# Patient Record
Sex: Male | Born: 1985 | Race: White | Hispanic: No | Marital: Single | Smoking: Current every day smoker
Health system: Southern US, Community
[De-identification: ages and names within clinical notes are randomized; demographics above are authoritative.]

## PROBLEM LIST (undated history)

## (undated) DIAGNOSIS — F429 Obsessive-compulsive disorder, unspecified: Secondary | ICD-10-CM

## (undated) DIAGNOSIS — F988 Other specified behavioral and emotional disorders with onset usually occurring in childhood and adolescence: Secondary | ICD-10-CM

## (undated) HISTORY — DX: Obsessive-compulsive disorder, unspecified: F42.9

## (undated) HISTORY — PX: EYE SURGERY: SHX253

## (undated) HISTORY — PX: HERNIA REPAIR: SHX51

## (undated) HISTORY — PX: TYMPANOSTOMY TUBE PLACEMENT: SHX32

## (undated) HISTORY — DX: Other specified behavioral and emotional disorders with onset usually occurring in childhood and adolescence: F98.8

---

## 2014-08-16 ENCOUNTER — Ambulatory Visit (INDEPENDENT_AMBULATORY_CARE_PROVIDER_SITE_OTHER): Payer: Self-pay | Admitting: Family Medicine

## 2014-08-16 VITALS — BP 116/80 | HR 62 | Temp 97.9°F | Resp 20 | Ht 67.75 in | Wt 229.5 lb

## 2014-08-16 DIAGNOSIS — F42 Obsessive-compulsive disorder: Secondary | ICD-10-CM

## 2014-08-16 DIAGNOSIS — F988 Other specified behavioral and emotional disorders with onset usually occurring in childhood and adolescence: Secondary | ICD-10-CM

## 2014-08-16 DIAGNOSIS — F909 Attention-deficit hyperactivity disorder, unspecified type: Secondary | ICD-10-CM

## 2014-08-16 DIAGNOSIS — F429 Obsessive-compulsive disorder, unspecified: Secondary | ICD-10-CM

## 2014-08-16 MED ORDER — AMPHETAMINE-DEXTROAMPHETAMINE 30 MG PO TABS: 30.0000 mg | ORAL_TABLET | Freq: Every day | ORAL | Status: DC

## 2014-08-16 NOTE — Patient Instructions (Signed)
[facility] Policy for Prescribing Controlled Substances (Revised 07/2012) 1. Prescriptions for controlled substances will be filled by ONE provider at [facility] with whom you have established and developed a plan for your care, including follow-up. 2. You are encouraged to schedule an appointment with your prescriber at our appointment center for follow-up visits whenever possible. 3. If you request a prescription for the controlled substance while at [facility] for an acute problem (with someone other than your regular prescriber), you MAY be given a ONE-TIME prescription for a 30-day supply of the controlled substance, to allow time for you to return to see your regular prescriber for additional prescriptions. 

## 2014-08-16 NOTE — Progress Notes (Signed)
Subjective:    Patient ID: Matthew Hines, male    DOB: 1985/10/22, 27 y.o.   MRN: [id number]  08/16/2014  Medication Refill   HPI This 28 y.o. male presents for evaluation of medication refill.  Requesting refill for Adderall.  Moved here from [place]; moved to [place] one month ago.  Evaluated one month ago by Kathreen Devoid; prescribed on month refill of Adderall 30mg  daily at [facility] after hours.  No family care.  Diagnosed age 75.  Recently reevaluated in 2013.  Previous use of Ritalin, generic Adderall, Adderall XR name brand,  Current dose since high school for past seven years.  No side effects.  Sleeps well.  No headaches; no palpitations.  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  Moved [place] for employment.  Cousin in [place] and in [place].  Excessive worry; diagnosed with OCD in past.     Review of Systems  Constitutional: Negative for fever, chills, diaphoresis, activity change, appetite change and fatigue.  Eyes: Negative for visual disturbance.  Respiratory: Negative for cough and shortness of breath.   Cardiovascular: Negative for chest pain, palpitations and leg swelling.  Endocrine: Negative for cold intolerance, heat intolerance, polydipsia, polyphagia and polyuria.  Neurological: Negative for dizziness, tremors, seizures, syncope, facial asymmetry, speech difficulty, weakness, light-headedness, numbness and headaches.    Past Medical History  Diagnosis Date  . ADD (attention deficit disorder)   . OCD (obsessive compulsive disorder)    Past Surgical History  Procedure Laterality Date  . Hernia repair    . Eye surgery    . Tympanostomy tube placement     Allergies  Allergen Reactions  . Amoxicillin Rash  . Penicillins Rash   Current Outpatient Prescriptions  Medication Sig Dispense Refill  . amphetamine-dextroamphetamine (ADDERALL) 30 MG tablet Take 1 tablet by mouth daily. 30 tablet 0   No current facility-administered medications for  this visit.       Objective:    BP 116/80 mmHg  Pulse 62  Temp(Src) 97.9 F (36.6 C) (Oral)  Resp 20  Ht 5' 7.75" (1.721 m)  Wt 229 lb 8 oz (104.101 kg)  BMI 35.15 kg/m2  SpO2 97% Physical Exam  Constitutional: He is oriented to person, place, and time. He appears well-developed and well-nourished. No distress.  HENT:  Head: Normocephalic and atraumatic.  Eyes: Conjunctivae and EOM are normal. Pupils are equal, round, and reactive to light.  Neck: Normal range of motion. Neck supple. Carotid bruit is not present. No thyromegaly present.  Cardiovascular: Normal rate, regular rhythm, normal heart sounds and intact distal pulses.  Exam reveals no gallop and no friction rub.   No murmur heard. Pulmonary/Chest: Effort normal and breath sounds normal. He has no wheezes. He has no rales.  Lymphadenopathy:    He has no cervical adenopathy.  Neurological: He is alert and oriented to person, place, and time. No cranial nerve deficit. He exhibits normal muscle tone. Coordination normal.  Skin: Skin is warm and dry. No rash noted. He is not diaphoretic.  Psychiatric: He has a normal mood and affect. His behavior is normal. Judgment and thought content normal.  Nursing note and vitals reviewed.  No results found for this or any previous visit.     Assessment & Plan:   1. ADD (attention deficit disorder)   2. OCD (obsessive compulsive disorder)     1.  ADD: controlled with Adderall 30mg  daily; rx provided.  RTC one month for reevaluation.  [facility] controlled substance  policy reviewed with patient. 2. OCD: stable; not interfering with job performance or personal life at this time.   Meds ordered this encounter  Medications  . DISCONTD: amphetamine-dextroamphetamine (ADDERALL) 30 MG tablet    Sig: Take 30 mg by mouth daily.  Marland Kitchen amphetamine-dextroamphetamine (ADDERALL) 30 MG tablet    Sig: Take 1 tablet by mouth daily.    Dispense:  30 tablet    Refill:  0    Return in about 4 weeks  (around 09/13/2014) for recheck.     Reginia Forts, M.D.  [facility] [address] [place], [place]  [address] [phone] phone [phone] fax

## 2014-08-20 NOTE — Progress Notes (Signed)
Per Dr. Thompson Caul note, appt scheduled in January.  Patient aware.

## 2014-09-17 ENCOUNTER — Encounter: Payer: Self-pay | Admitting: Family Medicine

## 2014-09-17 ENCOUNTER — Ambulatory Visit (INDEPENDENT_AMBULATORY_CARE_PROVIDER_SITE_OTHER): Payer: BLUE CROSS/BLUE SHIELD | Admitting: Family Medicine

## 2014-09-17 VITALS — BP 116/88 | HR 77 | Temp 98.0°F | Resp 16 | Ht 68.0 in | Wt 231.0 lb

## 2014-09-17 DIAGNOSIS — D179 Benign lipomatous neoplasm, unspecified: Secondary | ICD-10-CM

## 2014-09-17 DIAGNOSIS — F988 Other specified behavioral and emotional disorders with onset usually occurring in childhood and adolescence: Secondary | ICD-10-CM

## 2014-09-17 DIAGNOSIS — F909 Attention-deficit hyperactivity disorder, unspecified type: Secondary | ICD-10-CM

## 2014-09-17 MED ORDER — AMPHETAMINE-DEXTROAMPHETAMINE 30 MG PO TABS
30.0000 mg | ORAL_TABLET | Freq: Every day | ORAL | Status: DC
Start: 2014-09-17 — End: 2014-09-17

## 2014-09-17 MED ORDER — AMPHETAMINE-DEXTROAMPHETAMINE 30 MG PO TABS: 30.0000 mg | ORAL_TABLET | Freq: Every day | ORAL | Status: DC

## 2014-09-17 NOTE — Patient Instructions (Signed)
We have refilled your prescription for the next three months and will plan to see you back in 3 months. We will continue the discussion next time we see you to plan to transition off your medication in the future. Starting an exercise routine about 30-45 minutes per day most days of the week is a great idea for general health. Continue eating your healthy diet.

## 2014-09-17 NOTE — Progress Notes (Signed)
Subjective:    Patient ID: Matthew Hines, male    DOB: October 27, 1985, 29 y.o.   MRN: [id number]  PCP: No PCP Per Patient  Chief Complaint  Patient presents with  . Follow-up    ADDERALL   There are no active problems to display for this patient.  Prior to Admission medications   Medication Sig Start Date End Date Taking? Authorizing Provider  amphetamine-dextroamphetamine (ADDERALL) 30 MG tablet Take 1 tablet by mouth daily. 09/17/14  Yes Matthew Honour, MD   Medications, allergies, past medical history, surgical history, family history, social history and problem list reviewed and updated.  HPI  29 yom with pmh add presents to clinic for refill of adderall.  He saw Matthew Hines in [facility] clinic last month as a new pt having moved from [place]. Has been on different ADD meds for many yrs and has been on his current dose of adderall for over 5 yrs. A one month refill of adderall was given last month and pt instructed to f/u in [facility] clinic.  Today he states he takes his med every day. It helps him with concentration both at work and also doing day to day tasks. 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 He would prefer eventually to go off the medication but now is not a great time as he is here for work and wanting to work his way up in his company.   He thinks he has always had the inattentive form of add and not hyperactive even as a kid.   He does not exercise. He eats a healthy diet with lots of veggies.   Feels his appetite is ok. He is sleeping fine and denies any palps.   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 These have been present and stable for several years. No pain. He wonders if there is anything he can take for them.   Review of Systems  Respiratory: Negative for shortness of breath and stridor.   Cardiovascular: Negative for chest pain, palpitations and leg swelling.  Gastrointestinal:  Negative for nausea, vomiting and diarrhea.  Neurological: Negative for dizziness and headaches.  Psychiatric/Behavioral: Positive for decreased concentration. Negative for sleep disturbance and dysphoric mood. The patient is not nervous/anxious.    No CP, SOB, fever, chills, HA, abd pain, constipation, diarrhea.     Objective:   Physical Exam  Constitutional: He is oriented to person, place, and time. He appears well-developed and well-nourished.  Non-toxic appearance. He does not have a sickly appearance. He does not appear ill. No distress.  BP 116/88 mmHg  Pulse 77  Temp(Src) 98 F (36.7 C) (Oral)  Resp 16  Ht 5\' 8"  (1.727 m)  Wt 231 lb (104.781 kg)  BMI 35.13 kg/m2  SpO2 97%   HENT:  Head: Normocephalic and atraumatic.  Eyes: Conjunctivae and EOM are normal. Pupils are equal, round, and reactive to light.  Neck: Normal range of motion. Neck supple. No thyromegaly present.  Cardiovascular: Normal rate, regular rhythm and normal heart sounds.   No murmur heard. Pulmonary/Chest: Effort normal and breath sounds normal. No respiratory distress. He has no wheezes. He has no rales.  Neurological: He is alert and oriented to person, place, and time.  Skin: He is not diaphoretic.     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 No overlying erythema or skin changes. No TTP. Similar lipomatous lesion R  mid abdomen.  Psychiatric: He has a normal mood and affect. His speech is normal and behavior is normal.  5/9 inattentive sx for add      Assessment & Plan:   29 yom with pmh add presents to clinic for refill of adderall.  ADD (attention deficit disorder) --currently has 5/9 inattentive sx for add --refilled adderall 30 mg qd for 3 months --continue current dose for now as pt believes it is helpful and he feels he needs it at his new job --rtc 3 months  Lipoma --multiple lipomas, pt instructed no concerning aspects of masses  Matthew Gutting, PA-C Physician  Assistant-Certified [facility]   Matthew Hines, M.D. [facility] [address] [place], [place]  [address] [phone] phone [phone] fax  09/17/2014 5:31 PM

## 2014-09-24 ENCOUNTER — Ambulatory Visit: Payer: Self-pay | Admitting: Family Medicine

## 2014-10-16 ENCOUNTER — Telehealth: Payer: Self-pay

## 2014-10-16 NOTE — Telephone Encounter (Signed)
Pt is wanting his amphetamine-dextroamphetamine (ADDERALL) 30 MG tablet [[id number] prescription sent to the [facility] on [address] in [place]. I told him the pharmacy said not to refill until 11/16/14. Please advise [phone]

## 2014-10-17 NOTE — Telephone Encounter (Signed)
Called pt and LMOM to call me back to clarify this message.

## 2014-10-17 NOTE — Telephone Encounter (Signed)
Patient returning call to the nurse regarding his "Adderall" medication being filled. Per patient his grandfather recently passed away and he is currently in [place] for the [place]. Patient stated he was given 3 prescriptions and he turned all of them in to the [facility] [address] in [place]. [facility] from here to [place] but they told him this medication would not be able to be transfer. [facility] in [place] told him they would except a fax prescription. Per patient he understands if he can not get a full prescription he just wants enough until he returns home. Patient to call back with the contact information for the [facility] in [place]. Patients call back number is [phone]

## 2014-10-17 NOTE — Telephone Encounter (Signed)
Patient calling back to give information for [facility] in [place] ph# [phone]   fax # [phone].

## 2014-10-18 NOTE — Telephone Encounter (Signed)
Spoke with pt, he should be back in town by Sunday. He has to work on Monday.

## 2014-10-18 NOTE — Telephone Encounter (Signed)
The patient called to get update on Rx status.  The patient states he is holding one pill to be able to drive safely home from [place], but that he really needs this Rx to get through the service for his grandfather who passed away.  Please advise patient.  The patient may be reached at [phone].

## 2014-10-18 NOTE — Telephone Encounter (Signed)
Call --- how long will patient be in [place]?

## 2014-10-19 MED ORDER — AMPHETAMINE-DEXTROAMPHETAMINE 30 MG PO TABS: 30.0000 mg | ORAL_TABLET | Freq: Every day | ORAL | Status: DC

## 2014-10-19 NOTE — Telephone Encounter (Signed)
Faxed Rx to pharmacy w/confirmation it went through. Notified pt.

## 2014-10-19 NOTE — Telephone Encounter (Signed)
1. Please fax rx to [facility] in [place], [place].  Fax:  [phone].  2. Please call and advise patient that rx faxed.

## 2014-12-17 ENCOUNTER — Encounter: Payer: Self-pay | Admitting: Family Medicine

## 2014-12-17 ENCOUNTER — Ambulatory Visit (INDEPENDENT_AMBULATORY_CARE_PROVIDER_SITE_OTHER): Payer: BLUE CROSS/BLUE SHIELD | Admitting: Family Medicine

## 2014-12-17 VITALS — BP 128/90 | HR 89 | Temp 98.0°F | Resp 18 | Ht 68.0 in | Wt 226.0 lb

## 2014-12-17 DIAGNOSIS — R030 Elevated blood-pressure reading, without diagnosis of hypertension: Secondary | ICD-10-CM | POA: Diagnosis not present

## 2014-12-17 DIAGNOSIS — E669 Obesity, unspecified: Secondary | ICD-10-CM | POA: Insufficient documentation

## 2014-12-17 DIAGNOSIS — N528 Other male erectile dysfunction: Secondary | ICD-10-CM | POA: Diagnosis not present

## 2014-12-17 DIAGNOSIS — F909 Attention-deficit hyperactivity disorder, unspecified type: Secondary | ICD-10-CM | POA: Diagnosis not present

## 2014-12-17 DIAGNOSIS — F988 Other specified behavioral and emotional disorders with onset usually occurring in childhood and adolescence: Secondary | ICD-10-CM

## 2014-12-17 DIAGNOSIS — IMO0001 Reserved for inherently not codable concepts without codable children: Secondary | ICD-10-CM

## 2014-12-17 MED ORDER — AMPHETAMINE-DEXTROAMPHET ER 30 MG PO CP24: 30.0000 mg | ORAL_CAPSULE | ORAL | Status: DC

## 2014-12-17 NOTE — Progress Notes (Signed)
Subjective:    Patient ID: Matthew Hines, male    DOB: [birth year], 29 y.o.   MRN: [id number]  12/17/2014  Medication Refill   HPI This 29 y.o. male presents for three month follow-up:   1. ADD:  Patient reports good compliance with medication, good tolerance to medication, and good symptom control.  Work is becoming very stressful.  In evenings, can tell when fading off.  Most days, concentration is good.  At our busiest time, seems to be doing well.  No side effects to medication; minor side effects at this time.  Noticed that medication fades around 4:00pm.  Also gets up to work at 6:30am.  Can work until 6:00pm on some days.  Emotionally doing well.  No depressive symptoms; no anger.    2.  ED:  Erections are not what they previously were.  No recent physical in several years.  Now that insurance has kicked in, ready to undergo testing.  Concerned that ED is due to Adderall at current dose.  Able to get erection, just not able to "go as long as normally goes".   3.  Health Maintenance: last Tetanus vaccine was [age].  Discussed getting it after hitting head on rusty dock.  Then fell in [place] afterwards; age 31.    48.  Vision issues: interested in Lasik for dominant eye; has esotropia.     Review of Systems  Constitutional: Negative for fever, chills, diaphoresis, activity change, appetite change and fatigue.  Eyes: Positive for visual disturbance.  Respiratory: Negative for cough and shortness of breath.   Cardiovascular: Negative for chest pain, palpitations and leg swelling.  Endocrine: Negative for cold intolerance, heat intolerance, polydipsia, polyphagia and polyuria.  Neurological: Negative for dizziness, tremors, seizures, syncope, facial asymmetry, speech difficulty, weakness, light-headedness, numbness and headaches.  Psychiatric/Behavioral: The patient is not nervous/anxious.     Past Medical History  Diagnosis Date  . ADD (attention deficit disorder)   . OCD (obsessive  compulsive disorder)    Past Surgical History  Procedure Laterality Date  . Hernia repair    . Eye surgery    . Tympanostomy tube placement     Allergies  Allergen Reactions  . Amoxicillin Rash  . Penicillins Rash   Current Outpatient Prescriptions  Medication Sig Dispense Refill  . amphetamine-dextroamphetamine (ADDERALL XR) 30 MG 24 hr capsule Take 1 capsule (30 mg total) by mouth every morning. 30 capsule 0   No current facility-administered medications for this visit.       Objective:    BP 128/90 mmHg  Pulse 89  Temp(Src) 98 F (36.7 C) (Oral)  Resp 18  Ht 5\' 8"  (1.727 m)  Wt 226 lb (102.513 kg)  BMI 34.37 kg/m2  SpO2 96% Physical Exam  Constitutional: He is oriented to person, place, and time. He appears well-developed and well-nourished. No distress.  HENT:  Head: Normocephalic and atraumatic.  Right Ear: External ear normal.  Left Ear: External ear normal.  Nose: Nose normal.  Mouth/Throat: Oropharynx is clear and moist.  Eyes: Conjunctivae are normal. Pupils are equal, round, and reactive to light.  Neck: Normal range of motion. Neck supple. Carotid bruit is not present. No thyromegaly present.  Cardiovascular: Normal rate, regular rhythm, normal heart sounds and intact distal pulses.  Exam reveals no gallop and no friction rub.   No murmur heard. Pulmonary/Chest: Effort normal and breath sounds normal. He has no wheezes. He has no rales.  Abdominal: Soft. Bowel sounds are normal. He exhibits no  distension and no mass. There is no tenderness. There is no rebound and no guarding.  Lymphadenopathy:    He has no cervical adenopathy.  Neurological: He is alert and oriented to person, place, and time. No cranial nerve deficit.  Skin: Skin is warm and dry. No rash noted. He is not diaphoretic.  Psychiatric: He has a normal mood and affect. His behavior is normal.  Nursing note and vitals reviewed.  No results found for this or any previous visit.       Assessment & Plan:   1. Attention deficit disorder   2. Obesity (BMI 35.0-39.9 without comorbidity)   3. Blood pressure elevated   4. Other male erectile dysfunction      1. ADD: controlled; will switch to Adderall XR 30mg  one tablet daily refills x 3 months provided. RTC three months. 2.  Obesity: recommend weight loss, exercise.  3.  Blood pressure elevated: worsening; recommend weight loss, exercise, low-salt food choices. 4.  ED: New. Mild; able to establish erection just having difficulties with maintaining erections; recommend obtaining labs at CPE including glucose, lipid, TSH, testosterone.      Meds ordered this encounter  Medications  . DISCONTD: amphetamine-dextroamphetamine (ADDERALL XR) 30 MG 24 hr capsule    Sig: Take 1 capsule (30 mg total) by mouth every morning.    Dispense:  30 capsule    Refill:  0  . DISCONTD: amphetamine-dextroamphetamine (ADDERALL XR) 30 MG 24 hr capsule    Sig: Take 1 capsule (30 mg total) by mouth every morning.    Dispense:  30 capsule    Refill:  0    DO NOT FILL UNTIL 01-16-15.  Marland Kitchen amphetamine-dextroamphetamine (ADDERALL XR) 30 MG 24 hr capsule    Sig: Take 1 capsule (30 mg total) by mouth every morning.    Dispense:  30 capsule    Refill:  0    DO NOT FILL UNTIL 02-16-15.    Return in about 3 months (around 03/18/2015) for complete physical examiniation.     Tannisha Kennington Elayne Guerin, M.D. [facility] [address] [place], [place]  [address] [phone] phone [phone] fax

## 2014-12-20 ENCOUNTER — Telehealth: Payer: Self-pay | Admitting: Family Medicine

## 2014-12-20 NOTE — Telephone Encounter (Signed)
Patient states his insurance won't cover his Adderrall XR. Sounds like he needs a PA but I am not sure. Please advise.  [phone]

## 2015-01-01 NOTE — Telephone Encounter (Signed)
Pa started on cover my meds 

## 2015-01-20 ENCOUNTER — Telehealth: Payer: Self-pay | Admitting: Family Medicine

## 2015-01-20 NOTE — Telephone Encounter (Signed)
Patient has a question about his Adderrall. Stated something about his Adderrall being $150.00 and generic vs name brand. Please help  [phone]

## 2015-01-21 NOTE — Telephone Encounter (Signed)
Spoke with pt. He states before we did something for the generic brand to where insurance was able to cover it. Now he is being rx'd the time release and he states insurance doesn't want to pay for it. He wants to know if there is anything we can do to get the cost down.

## 2015-01-21 NOTE — Telephone Encounter (Signed)
Tishira called me and advised pt stated his XR Adderall needs a waiver. I suspect this is a PA that is needed, but haven't received anything from pharm yet. Called pharm and they gave me ins info of Rx Options: [phone],  ID # [id number]. Called [employer] and they will send me a form to complete (was not able to complete on the phone today).

## 2015-01-31 NOTE — Telephone Encounter (Signed)
We have not received the PA form that we req'd from ins. I called [facility] again and was again advised that they can have someone call me back tomorrow for PA on the phone, but can not do it today. I asked her to have someone do this tomorrow, but to send the form again so that it can be sent back today. I waited by fax machine and received, completed and faxed back form. Marked it urgent since they did not send the form when originally req'd and now pt has been without his medication for over 10 days. Dr Tamala Julian, [facility].

## 2015-01-31 NOTE — Telephone Encounter (Signed)
What is status on prior authorization/waiver for this patient?

## 2015-02-01 NOTE — Telephone Encounter (Addendum)
PA was approved through 10/28/15. Notified pt and pharmacy.

## 2015-03-20 ENCOUNTER — Ambulatory Visit (INDEPENDENT_AMBULATORY_CARE_PROVIDER_SITE_OTHER): Payer: BLUE CROSS/BLUE SHIELD | Admitting: Physician Assistant

## 2015-03-20 VITALS — BP 130/86 | HR 97 | Temp 98.0°F | Resp 16 | Ht 67.5 in | Wt 237.0 lb

## 2015-03-20 DIAGNOSIS — F909 Attention-deficit hyperactivity disorder, unspecified type: Secondary | ICD-10-CM

## 2015-03-20 DIAGNOSIS — E669 Obesity, unspecified: Secondary | ICD-10-CM

## 2015-03-20 DIAGNOSIS — F988 Other specified behavioral and emotional disorders with onset usually occurring in childhood and adolescence: Secondary | ICD-10-CM

## 2015-03-20 MED ORDER — AMPHETAMINE-DEXTROAMPHET ER 30 MG PO CP24: 30.0000 mg | ORAL_CAPSULE | ORAL | Status: DC

## 2015-03-20 NOTE — Patient Instructions (Signed)
Return for complete physical with Dr. Tamala Julian on 7/27 at 9:15 am. Work on diet, exercise and weight loss.

## 2015-03-20 NOTE — Progress Notes (Signed)
[facility] [address], [place] [address] [phone]  Date:  03/20/2015   Name:  Matthew Hines   DOB:  [birth year]   MRN:  [id number]  PCP:  Reginia Forts, MD    Chief Complaint: Medication Refill   History of Present Illness:  This is a 29 y.o. male with PMH ADD who is presenting for a refill of his adderall. He generally sees Dr. Tamala Julian for follow  He has an appt for a CPE with her on 04/10/15 but is going to run out of his medication before then. Last visit 3 months ago he was changed from adderall SR 30 mg to adderall XR 30 mg. He had some problems getting insurance to cover but eventually got a waiver and now covered for the next 1 year. He states he does not see much of a difference between SR and XR. Feels the medication wearing off around 4:30 PM.  States when he doesn't take his medication he feels "unmotivated" "lazy" and "unproductive". He does not have any side effects to the medication. He doesn't want a higher dose of med, pointing out problems with insurance and feeling that a higher dose would be more unhealthy for him.  He has gained 11 pounds since last visit. He blames this on his new job that is more sedentary. He states he does not have enough free time to work out. He does have the weekends off but states "thats my time". He tries to do something active on the weekends like hiking or kayaking. He recently joined a men's rugby club and has been getting into that. He knows he needs to lose weight.  Review of Systems:  Review of Systems See HPI  Patient Active Problem List   Diagnosis Date Noted  . Obesity (BMI 35.0-39.9 without comorbidity) 12/17/2014  . Attention deficit disorder 12/17/2014    Prior to Admission medications   Medication Sig Start Date End Date Taking? Authorizing Provider  amphetamine-dextroamphetamine (ADDERALL XR) 30 MG 24 hr capsule Take 1 capsule (30 mg total) by mouth every morning. 12/17/14  Yes Wardell Honour, MD     Allergies  Allergen Reactions  . Amoxicillin Rash  . Penicillins Rash    Past Surgical History  Procedure Laterality Date  . Hernia repair    . Eye surgery    . Tympanostomy tube placement      History  Substance Use Topics  . Smoking status: Current Every Day Smoker -- 0.25 packs/day for 7 years    Types: Cigarettes  . Smokeless tobacco: Never Used  . Alcohol Use: 0.6 - 1.2 oz/week    1-2 Standard drinks or equivalent per week    Family History  Problem Relation Age of Onset  . Heart disease Maternal Grandmother   . Stroke Maternal Grandmother   . Heart disease Maternal Grandfather   . Obesity Mother     Medication list has been reviewed and updated.  Physical Examination:  Physical Exam  Constitutional: He is oriented to person, place, and time. He appears well-developed and well-nourished. No distress.  HENT:  Head: Normocephalic and atraumatic.  Right Ear: Hearing normal.  Left Ear: Hearing normal.  Nose: Nose normal.  Eyes: Conjunctivae and lids are normal. Right eye exhibits no discharge. Left eye exhibits no discharge. No scleral icterus.  Cardiovascular: Normal rate, regular rhythm, normal heart sounds and normal pulses.   No murmur heard. Pulmonary/Chest: Effort normal and breath sounds normal. No respiratory distress. He has  no wheezes. He has no rhonchi. He has no rales.  Musculoskeletal: Normal range of motion.  Neurological: He is alert and oriented to person, place, and time.  Skin: Skin is warm, dry and intact. No lesion and no rash noted.  Psychiatric: He has a normal mood and affect. His speech is normal and behavior is normal. Thought content normal.   BP 130/86 mmHg  Pulse 97  Temp(Src) 98 F (36.7 C) (Oral)  Resp 16  Ht 5' 7.5" (1.715 m)  Wt 237 lb (107.502 kg)  BMI 36.55 kg/m2  SpO2 98%  Assessment and Plan:  1. ADD (attention deficit disorder) 1 month refilled until CPE with Dr. Tamala Julian on 04/10/15. -  amphetamine-dextroamphetamine (ADDERALL XR) 30 MG 24 hr capsule; Take 1 capsule (30 mg total) by mouth every morning.  Dispense: 30 capsule; Refill: 0  2. Obesity (BMI 35.0-39.9 without comorbidity) 11 pound weight gain in 3 months. We discussed the importance of exercise, diet and weight loss.   Benjaman Pott Drenda Freeze, MHS [facility]  03/20/2015

## 2015-04-10 ENCOUNTER — Encounter: Payer: Self-pay | Admitting: Family Medicine

## 2015-04-10 ENCOUNTER — Ambulatory Visit (INDEPENDENT_AMBULATORY_CARE_PROVIDER_SITE_OTHER): Payer: BLUE CROSS/BLUE SHIELD | Admitting: Family Medicine

## 2015-04-10 VITALS — BP 133/81 | HR 93 | Temp 98.3°F | Resp 16 | Ht 68.5 in | Wt 231.6 lb

## 2015-04-10 DIAGNOSIS — Z6834 Body mass index (BMI) 34.0-34.9, adult: Secondary | ICD-10-CM

## 2015-04-10 DIAGNOSIS — F909 Attention-deficit hyperactivity disorder, unspecified type: Secondary | ICD-10-CM | POA: Diagnosis not present

## 2015-04-10 DIAGNOSIS — Z13220 Encounter for screening for lipoid disorders: Secondary | ICD-10-CM

## 2015-04-10 DIAGNOSIS — F988 Other specified behavioral and emotional disorders with onset usually occurring in childhood and adolescence: Secondary | ICD-10-CM

## 2015-04-10 DIAGNOSIS — E669 Obesity, unspecified: Secondary | ICD-10-CM | POA: Diagnosis not present

## 2015-04-10 DIAGNOSIS — Z7251 High risk heterosexual behavior: Secondary | ICD-10-CM

## 2015-04-10 DIAGNOSIS — Z0000 Encounter for general adult medical examination without abnormal findings: Secondary | ICD-10-CM | POA: Diagnosis not present

## 2015-04-10 DIAGNOSIS — Z131 Encounter for screening for diabetes mellitus: Secondary | ICD-10-CM | POA: Diagnosis not present

## 2015-04-10 LAB — COMPREHENSIVE METABOLIC PANEL
ALBUMIN: 4.7 g/dL (ref 3.6–5.1)
ALK PHOS: 78 U/L (ref 40–115)
ALT: 36 U/L (ref 9–46)
AST: 20 U/L (ref 10–40)
BILIRUBIN TOTAL: 2 mg/dL — AB (ref 0.2–1.2)
BUN: 15 mg/dL (ref 7–25)
CHLORIDE: 100 meq/L (ref 98–110)
CO2: 30 meq/L (ref 20–31)
CREATININE: 0.85 mg/dL (ref 0.60–1.35)
Calcium: 9.5 mg/dL (ref 8.6–10.3)
GLUCOSE: 93 mg/dL (ref 65–99)
POTASSIUM: 4.4 meq/L (ref 3.5–5.3)
SODIUM: 140 meq/L (ref 135–146)
Total Protein: 7.2 g/dL (ref 6.1–8.1)

## 2015-04-10 LAB — HIV ANTIBODY (ROUTINE TESTING W REFLEX): HIV: NONREACTIVE

## 2015-04-10 LAB — CBC WITH DIFFERENTIAL/PLATELET
Basophils Absolute: 0 10*3/uL (ref 0.0–0.1)
Basophils Relative: 0 % (ref 0–1)
EOS ABS: 0.2 10*3/uL (ref 0.0–0.7)
Eosinophils Relative: 2 % (ref 0–5)
HCT: 47.5 % (ref 39.0–52.0)
Hemoglobin: 16.7 g/dL (ref 13.0–17.0)
LYMPHS PCT: 22 % (ref 12–46)
Lymphs Abs: 2 10*3/uL (ref 0.7–4.0)
MCH: 28.9 pg (ref 26.0–34.0)
MCHC: 35.2 g/dL (ref 30.0–36.0)
MCV: 82.2 fL (ref 78.0–100.0)
MONO ABS: 0.7 10*3/uL (ref 0.1–1.0)
MPV: 10.4 fL (ref 8.6–12.4)
Monocytes Relative: 8 % (ref 3–12)
NEUTROS ABS: 6.1 10*3/uL (ref 1.7–7.7)
Neutrophils Relative %: 68 % (ref 43–77)
PLATELETS: 200 10*3/uL (ref 150–400)
RBC: 5.78 MIL/uL (ref 4.22–5.81)
RDW: 13.2 % (ref 11.5–15.5)
WBC: 8.9 10*3/uL (ref 4.0–10.5)

## 2015-04-10 LAB — LIPID PANEL
CHOL/HDL RATIO: 4.7 ratio (ref <=5.0)
Cholesterol: 140 mg/dL (ref 125–200)
HDL: 30 mg/dL — AB (ref >=40)
LDL Cholesterol: 71 mg/dL (ref <130)
TRIGLYCERIDES: 196 mg/dL — AB (ref <150)
VLDL: 39 mg/dL — AB (ref <30)

## 2015-04-10 LAB — VITAMIN D 25 HYDROXY (VIT D DEFICIENCY, FRACTURES): VIT D 25 HYDROXY: 38 ng/mL (ref 30–100)

## 2015-04-10 LAB — HEMOGLOBIN A1C
Hgb A1c MFr Bld: 5.3 % (ref <5.7)
Mean Plasma Glucose: 105 mg/dL (ref <117)

## 2015-04-10 LAB — TSH: TSH: 0.937 u[IU]/mL (ref 0.350–4.500)

## 2015-04-10 LAB — VITAMIN B12: Vitamin B-12: 750 pg/mL (ref 211–911)

## 2015-04-10 MED ORDER — AMPHETAMINE-DEXTROAMPHET ER 30 MG PO CP24: 30.0000 mg | ORAL_CAPSULE | ORAL | Status: DC

## 2015-04-10 NOTE — Patient Instructions (Signed)

## 2015-04-10 NOTE — Progress Notes (Signed)
Subjective:    Patient ID: Matthew Hines, male    DOB: 1986-03-18, 29 y.o.   MRN: [id number]  04/10/2015  Annual Exam; Allergic Rhinitis ; and Cough   HPI This 29 y.o. male presents for Complete Physical Examination.  Last physical:  Never/not sure; before high school TDAP:  reluctant Influenza: declined Eye exam:  Never; wants to undergo exam. Interested in [place]. Dental exam:  Regularly.  ADD:  One month written by Bennett Scrape, PA-C.  Obesity: had gained 11 pounds since last visit.  B12 vitamin and supplement. Some vitamin D.  Iodine. MVI sporadically.    Review of Systems  Constitutional: Negative for fever, chills, diaphoresis, activity change, appetite change, fatigue and unexpected weight change.  HENT: Negative for congestion, dental problem, drooling, ear discharge, ear pain, facial swelling, hearing loss, mouth sores, nosebleeds, postnasal drip, rhinorrhea, sinus pressure, sneezing, sore throat, tinnitus, trouble swallowing and voice change.   Eyes: Negative for photophobia, pain, discharge, redness, itching and visual disturbance.  Respiratory: Negative for apnea, cough, choking, chest tightness, shortness of breath, wheezing and stridor.   Cardiovascular: Negative for chest pain, palpitations and leg swelling.  Gastrointestinal: Negative for nausea, vomiting, abdominal pain, diarrhea, constipation and blood in stool.  Endocrine: Negative for cold intolerance, heat intolerance, polydipsia, polyphagia and polyuria.  Genitourinary: Negative for dysuria, urgency, frequency, hematuria, flank pain, decreased urine volume, discharge, penile swelling, scrotal swelling, enuresis, difficulty urinating, genital sores, penile pain and testicular pain.  Musculoskeletal: Negative for myalgias, back pain, joint swelling, arthralgias, gait problem, neck pain and neck stiffness.  Skin: Negative for color change, pallor, rash and wound.  Allergic/Immunologic: Negative for environmental  allergies, food allergies and immunocompromised state.  Neurological: Negative for dizziness, tremors, seizures, syncope, facial asymmetry, speech difficulty, weakness, light-headedness, numbness and headaches.  Hematological: Negative for adenopathy. Does not bruise/bleed easily.  Psychiatric/Behavioral: Negative for suicidal ideas, hallucinations, behavioral problems, confusion, sleep disturbance, self-injury, dysphoric mood, decreased concentration and agitation. The patient is not nervous/anxious and is not hyperactive.     Past Medical History  Diagnosis Date  . ADD (attention deficit disorder)   . OCD (obsessive compulsive disorder)    Past Surgical History  Procedure Laterality Date  . Hernia repair    . Eye surgery    . Tympanostomy tube placement     Allergies  Allergen Reactions  . Amoxicillin Rash  . Penicillins Rash   Current Outpatient Prescriptions  Medication Sig Dispense Refill  . amphetamine-dextroamphetamine (ADDERALL XR) 30 MG 24 hr capsule Take 1 capsule (30 mg total) by mouth every morning. 30 capsule 0   No current facility-administered medications for this visit.   History   Social History  . Marital Status: Single    Spouse Name: N/A  . Number of Children: N/A  . Years of Education: N/A   Occupational History  . Not on file.   Social History Main Topics  . Smoking status: Current Every Day Smoker -- 0.25 packs/day for 7 years    Types: Cigarettes  . Smokeless tobacco: Never Used  . Alcohol Use: 0.6 - 1.2 oz/week    1-2 Standard drinks or equivalent per week  . Drug Use: No  . Sexual Activity: Yes   Other Topics Concern  . 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  Last STD screening in college.    Dates females only.  Condoms most of time.    Family History  Problem Relation Age of Onset  . Heart disease Maternal Grandmother   . Stroke Maternal Grandmother   . Heart disease Maternal Grandfather   . Obesity Mother        Objective:    BP 133/81 mmHg  Pulse 93  Temp(Src) 98.3 F (36.8 C) (Oral)  Resp 16  Ht 5' 8.5" (1.74 m)  Wt 231 lb 9.6 oz (105.053 kg)  BMI 34.70 kg/m2  SpO2 100% Physical Exam  Constitutional: He is oriented to person, place, and time. He appears well-developed and well-nourished. No distress.  HENT:  Head: Normocephalic and atraumatic.  Right Ear: External ear normal.  Left Ear: External ear normal.  Nose: Nose normal.  Mouth/Throat: Oropharynx is clear and moist.  Eyes: Conjunctivae and EOM are normal. Pupils are equal, round, and reactive to light.  Neck: Normal range of motion. Neck supple. Carotid bruit is not present. No thyromegaly present.  Cardiovascular: Normal rate, regular rhythm, normal heart sounds and intact distal pulses.  Exam reveals no gallop and no friction rub.   No murmur heard. Pulmonary/Chest: Effort normal and breath sounds normal. He has no wheezes. He has no rales.  Abdominal: Soft. Bowel sounds are normal. He exhibits no distension and no mass. There is no tenderness. There is no rebound and no guarding.  Musculoskeletal:       Right shoulder: Normal.       Left shoulder: Normal.       Cervical back: Normal.  Lymphadenopathy:    He has no cervical adenopathy.  Neurological: He is alert and oriented to person, place, and time. He has normal reflexes. No cranial nerve deficit. He exhibits normal muscle tone. Coordination normal.  Skin: Skin is warm and dry. No rash noted. He is not diaphoretic.  Psychiatric: He has a normal  mood and affect. His behavior is normal. Judgment and thought content normal.   No results found for this or any previous visit.     Assessment & Plan:   1. Routine physical examination   2. Obesity   3. BMI 34.0-34.9,adult   4. Attention deficit disorder   5. Screening for diabetes mellitus   6. Screening, lipid   7. ADD (attention deficit disorder)   8. High risk sexual behavior     Meds ordered this encounter  Medications  . DISCONTD: amphetamine-dextroamphetamine (ADDERALL XR) 30 MG 24 hr capsule    Sig: Take 1 capsule (30 mg total) by mouth every morning.    Dispense:  30 capsule    Refill:  0    DO NOT FILL UNTIL 04-20-2015  . DISCONTD: amphetamine-dextroamphetamine (ADDERALL XR) 30 MG 24 hr capsule    Sig: Take 1 capsule (30 mg total) by mouth every morning.    Dispense:  30 capsule    Refill:  0    DO NOT FILL UNTIL 05-21-2015  . amphetamine-dextroamphetamine (ADDERALL XR) 30 MG 24  hr capsule    Sig: Take 1 capsule (30 mg total) by mouth every morning.    Dispense:  30 capsule    Refill:  0    DO NOT FILL UNTIL 06-20-2015    Return in about 4 months (around 08/11/2015) for recheck.     Kristi Elayne Guerin, M.D. [facility] [address] [place], [place]  [address] [phone] phone [phone] fax

## 2015-04-11 LAB — GC/CHLAMYDIA PROBE AMP
CT Probe RNA: NEGATIVE
GC PROBE AMP APTIMA: NEGATIVE

## 2015-04-11 LAB — RPR

## 2015-07-18 ENCOUNTER — Encounter: Payer: Self-pay | Admitting: Urgent Care

## 2015-07-18 ENCOUNTER — Ambulatory Visit (INDEPENDENT_AMBULATORY_CARE_PROVIDER_SITE_OTHER): Payer: BLUE CROSS/BLUE SHIELD | Admitting: Urgent Care

## 2015-07-18 VITALS — BP 131/89 | HR 63 | Temp 98.0°F | Resp 16 | Ht 69.5 in | Wt 234.0 lb

## 2015-07-18 DIAGNOSIS — F909 Attention-deficit hyperactivity disorder, unspecified type: Secondary | ICD-10-CM | POA: Diagnosis not present

## 2015-07-18 DIAGNOSIS — F988 Other specified behavioral and emotional disorders with onset usually occurring in childhood and adolescence: Secondary | ICD-10-CM

## 2015-07-18 MED ORDER — AMPHETAMINE-DEXTROAMPHET ER 30 MG PO CP24: 30.0000 mg | ORAL_CAPSULE | ORAL | Status: DC

## 2015-07-18 NOTE — Progress Notes (Signed)
    MRN: [id number] DOB: April 09, 1986  Subjective:   Matthew Hines is a 29 y.o. male presenting for chief complaint of Medication Refill  Patient had longstanding diagnosis ADD, has been managed well with either Ritalin and Adderall. Patient had a diagnosis as a child. He was retested as an adult when trying to become a Programme researcher, broadcasting/film/video but was denied due to testing positive again for ADD. He admits that he does well with the medication. He is otherwise distractible, lacks focus, moves from task to task before completing them, has difficulty holding conversations. Patient has been managed by Dr. Tamala Julian. [place] database looks good. Denies chest pain, heart racing, palpitations, mood changes, irritability, decreased appetite, difficulty sleeping, sweating, shakiness. Denies any other aggravating or relieving factors, no other questions or concerns.  Matthew Hines has a current medication list which includes the following prescription(s): amphetamine-dextroamphetamine. Also is allergic to amoxicillin and penicillins.  Matthew Hines  has a past medical history of ADD (attention deficit disorder) and OCD (obsessive compulsive disorder). Also  has past surgical history that includes Hernia repair; Eye surgery; and Tympanostomy tube placement.  Objective:   Vitals: BP 131/89 mmHg  Pulse 63  Temp(Src) 98 F (36.7 C)  Resp 16  Ht 5' 9.5" (1.765 m)  Wt 234 lb (106.142 kg)  BMI 34.07 kg/m2  Physical Exam  Constitutional: He is oriented to person, place, and time. He appears well-developed and well-nourished.  HENT:  Mouth/Throat: Oropharynx is clear and moist.  Eyes: No scleral icterus.  Neck: Normal range of motion. Neck supple. No thyromegaly present.  Cardiovascular: Normal rate, regular rhythm and intact distal pulses.  Exam reveals no gallop and no friction rub.   No murmur heard. Pulmonary/Chest: No respiratory distress. He has no wheezes. He has no rales.  Neurological: He is alert and oriented to person,  place, and time.  Skin: Skin is warm and dry. No rash noted. No erythema. No pallor.   Assessment and Plan :   1. ADD (attention deficit disorder) - Stable, 3 month refills provided, recommended f/u in 3 months either with Dr. Tamala Julian or myself.  Matthew Eagles, PA-C [facility] [phone] 07/18/2015 4:40 PM

## 2015-07-18 NOTE — Patient Instructions (Signed)
Amphetamine; Dextroamphetamine extended-release capsules What is this medicine? AMPHETAMINE; DEXTROAMPHETAMINE (am FET a meen; dex troe am FET a meen) is used to treat attention-deficit hyperactivity disorder (ADHD). Federal law prohibits giving this medicine to any person other than the person for whom it was prescribed. Do not share this medicine with anyone else. This medicine may be used for other purposes; ask your health care provider or pharmacist if you have questions. What should I tell my health care provider before I take this medicine? They need to know if you have any of these conditions: -anxiety or panic attacks -circulation problems in fingers and toes -glaucoma -hardening or blockages of the arteries or heart blood vessels -heart disease or a heart defect -high blood pressure -history of a drug or alcohol abuse problem -history of stroke -kidney disease -liver disease -mental illness -seizures -suicidal thoughts, plans, or attempt; a previous suicide attempt by you or a family member -thyroid disease -Tourette's syndrome -an unusual or allergic reaction to dextroamphetamine, other amphetamines, other medicines, foods, dyes, or preservatives -pregnant or trying to get pregnant -breast-feeding How should I use this medicine? Take this medicine by mouth with a glass of water. Follow the directions on the prescription label. This medicine is taken just one time per day, usually in the morning after waking up. Take with or without food. Do not chew or crush this medicine. You may open the capsules and sprinkle the medicine on a spoonful of applesauce. If sprinkled on applesauce, take the dose immediately and do not crush or chew. Always drink a glass of water or other liquid after taking this medicine. Do not take your medicine more often than directed. A special MedGuide will be given to you by the pharmacist with each prescription and refill. Be sure to read this information  carefully each time. Talk to your pediatrician regarding the use of this medicine in children. While this drug may be prescribed for children as young as 6 years for selected conditions, precautions do apply. Overdosage: If you think you have taken too much of this medicine contact a poison control center or emergency room at once. NOTE: This medicine is only for you. Do not share this medicine with others. What if I miss a dose? If you miss a dose, take it as soon as you can. If it is almost time for your next dose, take only that dose. Do not take double or extra doses. What may interact with this medicine? Do not take this medicine with any of the following medications: -MAOIs like Carbex, Eldepryl, Marplan, Nardil, and Parnate -other stimulant medicines for attention disorders, weight loss, or to stay awake This medicine may also interact with the following medications: -acetazolamide -ammonium chloride -antacids -ascorbic acid -atomoxetine -caffeine -certain medicines for blood pressure -certain medicines for depression, anxiety, or psychotic disturbances -certain medicines for diabetes -certain medicines for seizures like carbamazepine, phenobarbital, phenytoin -certain medicines for stomach problems like cimetidine, famotidine, omeprazole, lansoprazole -cold or allergy medicines -glutamic acid -lithium -meperidine -methenamine; sodium acid phosphate -narcotic medicines for pain -norepinephrine -phenothiazines like chlorpromazine, mesoridazine, prochlorperazine, thioridazine -sodium bicarbonate This list may not describe all possible interactions. Give your health care provider a list of all the medicines, herbs, non-prescription drugs, or dietary supplements you use. Also tell them if you smoke, drink alcohol, or use illegal drugs. Some items may interact with your medicine. What should I watch for while using this medicine? Visit your doctor or health care professional for  regular checks on your  progress. This prescription requires that you follow special procedures with your doctor and pharmacy. You will need to have a new written prescription from your doctor every time you need a refill. This medicine may affect your concentration, or hide signs of tiredness. Until you know how this medicine affects you, do not drive, ride a bicycle, use machinery, or do anything that needs mental alertness. Tell your doctor or health care professional if this medicine loses its effects, or if you feel you need to take more than the prescribed amount. Do not change the dosage without talking to your doctor or health care professional. Decreased appetite is a common side effect when starting this medicine. Eating small, frequent meals or snacks can help. Talk to your doctor if you continue to have poor eating habits. Height and weight growth of a child taking this medicine will be monitored closely. Do not take this medicine close to bedtime. It may prevent you from sleeping. If you are going to need surgery, an MRI, a CT scan, or other procedure, tell your doctor that you are taking this medicine. You may need to stop taking this medicine before the procedure. Tell your doctor or healthcare professional right away if you notice unexplained wounds on your fingers and toes while taking this medicine. You should also tell your healthcare provider if you experience numbness or pain, changes in the skin color, or sensitivity to temperature in your fingers or toes. What side effects may I notice from receiving this medicine? Side effects that you should report to your doctor or health care professional as soon as possible: -allergic reactions like skin rash, itching or hives, swelling of the face, lips, or tongue -changes in vision -chest pain or chest tightness -confusion, trouble speaking or understanding -fast, irregular heartbeat -fingers or toes feel numb, cool,  painful -hallucination, loss of contact with reality -high blood pressure -males: prolonged or painful erection -seizures -severe headaches -shortness of breath -suicidal thoughts or other mood changes -trouble walking, dizziness, loss of balance or coordination -uncontrollable head, mouth, neck, arm, or leg movements Side effects that usually do not require medical attention (report to your doctor or health care professional if they continue or are bothersome): -anxious -headache -loss of appetite -nausea, vomiting -trouble sleeping -weight loss This list may not describe all possible side effects. Call your doctor for medical advice about side effects. You may report side effects to FDA at 1-800-FDA-1088. Where should I keep my medicine? Keep out of the reach of children. This medicine can be abused. Keep your medicine in a safe place to protect it from theft. Do not share this medicine with anyone. Selling or giving away this medicine is dangerous and against the law. Store at room temperature between 15 and 30 degrees C (59 and 86 degrees F). Keep container tightly closed. Protect from light. Throw away any unused medicine after the expiration date. NOTE: This sheet is a summary. It may not cover all possible information. If you have questions about this medicine, talk to your doctor, pharmacist, or health care provider.    2016, [employer]. (2014-07-04 18:22:45)

## 2015-08-12 ENCOUNTER — Ambulatory Visit: Payer: Self-pay | Admitting: Family Medicine

## 2015-10-16 ENCOUNTER — Ambulatory Visit (INDEPENDENT_AMBULATORY_CARE_PROVIDER_SITE_OTHER): Payer: BLUE CROSS/BLUE SHIELD | Admitting: Family Medicine

## 2015-10-16 ENCOUNTER — Ambulatory Visit (INDEPENDENT_AMBULATORY_CARE_PROVIDER_SITE_OTHER): Payer: BLUE CROSS/BLUE SHIELD

## 2015-10-16 VITALS — BP 130/80 | HR 90 | Temp 98.2°F | Resp 17 | Ht 69.0 in | Wt 243.0 lb

## 2015-10-16 DIAGNOSIS — M545 Low back pain, unspecified: Secondary | ICD-10-CM

## 2015-10-16 DIAGNOSIS — S62607A Fracture of unspecified phalanx of left little finger, initial encounter for closed fracture: Secondary | ICD-10-CM | POA: Diagnosis not present

## 2015-10-16 DIAGNOSIS — M79672 Pain in left foot: Secondary | ICD-10-CM

## 2015-10-16 DIAGNOSIS — M79645 Pain in left finger(s): Secondary | ICD-10-CM

## 2015-10-16 DIAGNOSIS — S62609A Fracture of unspecified phalanx of unspecified finger, initial encounter for closed fracture: Secondary | ICD-10-CM

## 2015-10-16 DIAGNOSIS — F909 Attention-deficit hyperactivity disorder, unspecified type: Secondary | ICD-10-CM

## 2015-10-16 DIAGNOSIS — M79671 Pain in right foot: Secondary | ICD-10-CM | POA: Diagnosis not present

## 2015-10-16 DIAGNOSIS — F988 Other specified behavioral and emotional disorders with onset usually occurring in childhood and adolescence: Secondary | ICD-10-CM

## 2015-10-16 MED ORDER — AMPHETAMINE-DEXTROAMPHET ER 30 MG PO CP24: 30.0000 mg | ORAL_CAPSULE | ORAL | Status: DC

## 2015-10-16 NOTE — Patient Instructions (Signed)
Because you received an x-ray today, you will receive an invoice from [facility]. Please contact [facility] at [phone] with questions or concerns regarding your invoice. Our billing staff will not be able to assist you with those questions. °

## 2015-10-16 NOTE — Progress Notes (Signed)
Subjective:    Patient ID: Matthew Hines, male    DOB: 1986-06-14, 30 y.o.   MRN: [id number]  10/16/2015  Medication Refill   HPI This 30 y.o. male presents for six months for follow-up of ADHD.  Appointment rescheduled; asked to move up appointment.   Tails off towards the end of the day.  No change from six months to twelve months ago. Work performance is good.  No side effects to medication. Emotionally doing well; no anxiety or depression.  Went to a podiatrist about some foot pain; insurance covers one set of insoles per year; did some xrays of feet; history of multiple metatarsal fractures in B feet.  Previous injuries of feet; tried some feet braces for arch support; no improvement with braces; due to back pain was related to feet pain; does get some numbness in feet; recommended seeing a neurologist; concern for neuropathy.  Last two toes are numb; can switch between feet.  Not thrilled with podiatrist.  Felt rushed during visit.  Might not have chosen those braces.  Recommended wearing braces B feet.  Advised that appointment would call for neurology appointment.  Dr. Gershon Mussel of podiatry foot and ankle in [place].   No plantar fasciitis diagnosis; first few steps are horribly painful.  Did not feel that insoles would be beneficial.  Feet pain for few years; only after strenuous activity, long day at work, and first thing in the morning.    Numbness in feet: mostly after rugby.  Usually occurs after sports.  Feels like foot hangs over cleats.  Had not thought it was super serious.  Always goes away.  With first few steps, has horrible pain.  Grandmother with poor balance due to PN.    Lower back pain:  Onset two or three years.  No radiation; no n/t in legs; only n/t in feet.    L fifth finger injury and swelling: got hung on [place] and deviated out. Injury one week ago.  Swelling has improved; range of motion slowly improving.  Pain improving.     Review of Systems  Constitutional:  Negative for fever, chills, diaphoresis, activity change, appetite change and fatigue.  Respiratory: Negative for cough and shortness of breath.   Cardiovascular: Negative for chest pain, palpitations and leg swelling.  Gastrointestinal: Negative for nausea, vomiting, abdominal pain and diarrhea.  Endocrine: Negative for cold intolerance, heat intolerance, polydipsia, polyphagia and polyuria.  Genitourinary: Negative for decreased urine volume and difficulty urinating.  Musculoskeletal: Positive for back pain, joint swelling and arthralgias.  Skin: Negative for color change, rash and wound.  Neurological: Positive for numbness. Negative for dizziness, tremors, seizures, syncope, facial asymmetry, speech difficulty, weakness, light-headedness and headaches.  Psychiatric/Behavioral: Negative for sleep disturbance and dysphoric mood. The patient is not nervous/anxious.     Past Medical History  Diagnosis Date  . ADD (attention deficit disorder)   . OCD (obsessive compulsive disorder)    Past Surgical History  Procedure Laterality Date  . Hernia repair    . Eye surgery    . Tympanostomy tube placement     Allergies  Allergen Reactions  . Amoxicillin Rash  . Penicillins Rash    Social History   Social History  . Marital Status: Single    Spouse Name: N/A  . Number of Children: N/A  . Years of Education: N/A   Occupational History  . Not on file.   Social History Main Topics  . Smoking status: Current Every Day Smoker -- 0.25 packs/day  for 11 years    Types: Cigarettes  . Smokeless tobacco: Never Used  . Alcohol Use: 0.6 - 1.2 oz/week    1-2 Standard drinks or equivalent per week  . Drug Use: No  . Sexual Activity: Yes   Other Topics Concern  . 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  Last STD screening in college.    Dates females only.  Condoms most of time.    Family History  Problem Relation Age of Onset  . Heart disease Maternal Grandmother   . Stroke Maternal Grandmother   . Heart disease Maternal Grandfather   . Obesity Mother        Objective:    BP 130/80 mmHg  Pulse 90  Temp(Src) 98.2 F (36.8 C) (Oral)  Resp 17  Ht _0  (1.753 m)  Wt 243 lb (110.224 kg)  BMI 35.87 kg/m2  SpO2 97% Physical Exam  Constitutional: He is oriented to person, place, and time. He appears well-developed and well-nourished. No distress.  HENT:  Head: Normocephalic and atraumatic.  Right Ear: External ear normal.  Left Ear: External ear normal.  Nose: Nose normal.  Mouth/Throat: Oropharynx is clear and moist.  Eyes: Conjunctivae and EOM are normal. Pupils are equal, round, and reactive to light.  Neck: Normal range of motion. Neck supple. Carotid bruit is not present. No thyromegaly present.  Cardiovascular: Normal rate, regular rhythm, normal heart sounds and intact distal pulses.  Exam reveals no gallop and no friction rub.   No murmur heard. Pulmonary/Chest: Effort normal and breath sounds normal. He has no wheezes. He has no rales.  Abdominal: Soft. Bowel sounds are normal. He exhibits no distension and no mass. There is no tenderness. There is no rebound and no guarding.  Musculoskeletal:       Right ankle: Normal. He exhibits normal range of motion and no swelling. No tenderness. No lateral malleolus and no medial malleolus tenderness found.       Left ankle: Normal. He exhibits normal range of motion and no swelling. No tenderness. No lateral malleolus and no medial malleolus tenderness found.        Lumbar back: Normal. He exhibits normal range of motion, no tenderness, no bony tenderness, no pain and no spasm.       Right foot: Normal. There is normal range of motion, no tenderness and no bony tenderness.       Left foot: Normal. There is normal range of motion, no tenderness and no bony tenderness.  Lumbar spine:  Non-tender midline; non-tender paraspinal regions B.  Straight leg raises negative B; toe and heel walking intact; marching intact; motor 5/5 BLE.  Full ROM lumbar spine without limitation. L fifth finger: swelling at PIP joint; decreased ROM; no laxity of joint.  Lymphadenopathy:    He has no cervical adenopathy.  Neurological: He is alert and oriented to person, place, and time. No cranial nerve deficit.  Skin: Skin is warm and dry. No rash noted. He is not diaphoretic.  Psychiatric: He has a normal mood and affect. His behavior is normal.  Nursing note and vitals reviewed.  Results for orders placed or performed in visit on 04/10/15  GC/Chlamydia Probe Amp  Result Value Ref Range   CT Probe RNA NEGATIVE    GC Probe RNA NEGATIVE   CBC with Differential/Platelet  Result Value Ref Range   WBC 8.9 4.0 - 10.5 K/uL   RBC 5.78 4.22 - 5.81 MIL/uL   Hemoglobin 16.7 13.0 - 17.0 g/dL   HCT 47.5 39.0 - 52.0 %   MCV 82.2 78.0 - 100.0 fL   MCH 28.9 26.0 - 34.0 pg   MCHC 35.2 30.0 - 36.0 g/dL   RDW 13.2 11.5 - 15.5 %   Platelets 200 150 - 400 K/uL   MPV 10.4 8.6 - 12.4 fL   Neutrophils Relative % 68 43 - 77 %   Neutro Abs 6.1 1.7 - 7.7 K/uL   Lymphocytes Relative 22 12 - 46 %   Lymphs Abs 2.0 0.7 - 4.0 K/uL   Monocytes Relative 8 3 - 12 %   Monocytes Absolute 0.7 0.1 - 1.0 K/uL   Eosinophils Relative 2 0 - 5 %   Eosinophils Absolute 0.2 0.0 - 0.7 K/uL   Basophils Relative 0 0 - 1 %   Basophils Absolute 0.0 0.0 - 0.1 K/uL   Smear Review Criteria for review not met   Comprehensive metabolic panel  Result Value Ref Range   Sodium 140 135 - 146 mEq/L   Potassium 4.4 3.5 -  5.3 mEq/L   Chloride 100 98 - 110 mEq/L   CO2 30 20 - 31 mEq/L   Glucose, Bld 93 65 - 99 mg/dL   BUN 15 7 - 25 mg/dL   Creat 0.85 0.60 - 1.35 mg/dL   Total Bilirubin 2.0 (H) 0.2 - 1.2 mg/dL   Alkaline Phosphatase 78 40 - 115 U/L   AST 20 10 - 40 U/L   ALT 36 9 - 46 U/L   Total Protein 7.2 6.1 - 8.1 g/dL   Albumin 4.7 3.6 - 5.1 g/dL   Calcium 9.5 8.6 - 10.3 mg/dL  Hemoglobin A1c  Result Value Ref Range   Hgb A1c MFr Bld 5.3 <5.7 %   Mean Plasma Glucose 105 <117 mg/dL  Lipid panel  Result Value Ref Range   Cholesterol 140 125 - 200 mg/dL   Triglycerides 196 (H) <150 mg/dL   HDL 30 (L) >=40 mg/dL   Total CHOL/HDL Ratio 4.7 <=5.0 Ratio   VLDL 39 (H) <30 mg/dL   LDL Cholesterol 71 <130 mg/dL  TSH  Result Value Ref Range   TSH 0.937 0.350 - 4.500 uIU/mL  Vitamin B12  Result Value Ref Range   Vitamin B-12 750 211 - 911 pg/mL  Vit D  25 hydroxy (rtn osteoporosis monitoring)  Result Value Ref Range   Vit D, 25-Hydroxy 38 30 - 100 ng/mL  HIV antibody  Result Value Ref Range   HIV 1&2 Ab, 4th Generation NONREACTIVE NONREACTIVE  RPR  Result Value Ref Range   RPR Ser Ql NON REAC NON REAC       Assessment & Plan:   1. ADD (attention deficit disorder)   2. Pain in both feet   3. Pain in finger of left hand   4.  Finger fracture, left, closed, initial encounter   5. Bilateral low back pain without sciatica     1.  ADD: controlled; rx for Adderall XR 48m daily provided; three months of rx provided; call in three months for refills. RTC six months. 2. Pain in both feet:  New to this provider; s/p podiatry consultation and desires second opinion; symptoms highly suggestive of plantar fasciitis; having some post-exercise paresthesias; doubt peripheral neuropathy.  Refer for podiatry for second opinion; recommend getting new cleats. 3.  Pain finger L fifth digit PIP/L fifth finger avulsion fracture at PIP: New. Injury one week ago; no laxity or deformity or weakness appreciated.   Recommend buddy taping finger daily for two weeks.   4.  Lower back pain: chronic; recommend weight loss, exercise, regular core strengthening exercises.    Orders Placed This Encounter  Procedures  . DG Finger Little Left    Standing Status: Future     Number of Occurrences: 1     Standing Expiration Date: 10/15/2016    Order Specific Question:  Reason for Exam (SYMPTOM  OR DIAGNOSIS REQUIRED)    Answer:  rugby injury one week ago; swelling at PIP    Order Specific Question:  Preferred imaging location?    Answer:  External  . Ambulatory referral to Podiatry    Referral Priority:  Routine    Referral Type:  Consultation    Referral Reason:  Specialty Services Required    Requested Specialty:  Podiatry    Number of Visits Requested:  1   Meds ordered this encounter  Medications  . DISCONTD: amphetamine-dextroamphetamine (ADDERALL XR) 30 MG 24 hr capsule    Sig: Take 1 capsule (30 mg total) by mouth every morning. DO NOT FILL UNTIL 10-18-2015.    Dispense:  30 capsule    Refill:  0  . DISCONTD: amphetamine-dextroamphetamine (ADDERALL XR) 30 MG 24 hr capsule    Sig: Take 1 capsule (30 mg total) by mouth every morning. DO NOT FILL UNTIL 11-15-2015.    Dispense:  30 capsule    Refill:  0  . amphetamine-dextroamphetamine (ADDERALL XR) 30 MG 24 hr capsule    Sig: Take 1 capsule (30 mg total) by mouth every morning. DO NOT FILL UNTIL 12-16-2015.    Dispense:  30 capsule    Refill:  0    Return in about 6 months (around 04/14/2016) for recheck ADHD.   Pape Parson MElayne Guerin M.D. [id number] [place]  [phone]

## 2015-10-18 ENCOUNTER — Ambulatory Visit: Payer: BLUE CROSS/BLUE SHIELD | Admitting: Family Medicine

## 2015-10-22 ENCOUNTER — Ambulatory Visit: Payer: BLUE CROSS/BLUE SHIELD | Admitting: Family Medicine

## 2015-10-31 ENCOUNTER — Ambulatory Visit (INDEPENDENT_AMBULATORY_CARE_PROVIDER_SITE_OTHER): Payer: BLUE CROSS/BLUE SHIELD

## 2015-10-31 ENCOUNTER — Ambulatory Visit (INDEPENDENT_AMBULATORY_CARE_PROVIDER_SITE_OTHER): Payer: BLUE CROSS/BLUE SHIELD | Admitting: Podiatry

## 2015-10-31 ENCOUNTER — Encounter: Payer: Self-pay | Admitting: Podiatry

## 2015-10-31 VITALS — BP 130/87 | HR 104 | Resp 16 | Ht 69.0 in | Wt 234.0 lb

## 2015-10-31 DIAGNOSIS — M779 Enthesopathy, unspecified: Secondary | ICD-10-CM

## 2015-10-31 DIAGNOSIS — M79673 Pain in unspecified foot: Secondary | ICD-10-CM | POA: Diagnosis not present

## 2015-10-31 NOTE — Progress Notes (Signed)
Subjective:     Patient ID: Matthew Hines, male   DOB: 12-10-1985, 30 y.o.   MRN: [id number]  HPI patient presents stating he's here for a second opinion and he has had a diagnosis of stress fractures in his feet and he just developed some discomfort of these on them all day or when he plays rugby   Review of Systems  All other systems reviewed and are negative.      Objective:   Physical Exam  Constitutional: He is oriented to person, place, and time.  Cardiovascular: Intact distal pulses.   Musculoskeletal: Normal range of motion.  Neurological: He is oriented to person, place, and time.  Skin: Skin is warm.  Nursing note and vitals reviewed.  neurovascular status found to be intact muscle strength was adequate with mild equinus condition bilateral. Patient's found to have no diminishment of sharp dull or vibratory and DTR reflexes normal. Asian does have equinus and was found to have good digital perfusion and well oriented 3     Assessment:     Appears to be localized inflammatory condition with no indications of systemic problems    Plan:     H&P and x-rays reviewed with patient. Today I went ahead and dispensed over-the-counter type insoles to provide for better plantar cushion and support and advised on heat and ice therapy and reappoint if symptoms persist  X-ray reports indicated small talar spur but no indications currently of stress fracture or advanced arthritic conditions with no depression of the arch noted

## 2015-10-31 NOTE — Progress Notes (Signed)
   Subjective:    Patient ID: Matthew Hines, male    DOB: Dec 09, 1985, 30 y.o.   MRN: [id number]  HPI Patient presents with bilateral foot pain; lateral side. Pt stated, "has numbness 4th & 5th toes occassionally"; Went to Dr. Gershon Mussel 2 weeks ago; Wants a 2nd opinion.  When saw Dr. Gershon Mussel two weeks ago, was told he had stress fractures in the metatarsal area on both feet. Dr. Gershon Mussel also saw broken bones in both feet as well. Said had spur on one of his heels.  Pt felt like he was being rushed in the room with Dr. Gershon Mussel and was not fully listening to him.   Review of Systems  All other systems reviewed and are negative.      Objective:   Physical Exam        Assessment & Plan:

## 2015-10-31 NOTE — Patient Instructions (Signed)
Achilles Tendinitis  with Rehab Achilles tendinitis is a disorder of the Achilles tendon. The Achilles tendon connects the large calf muscles (Gastrocnemius and Soleus) to the heel bone (calcaneus). This tendon is sometimes called the heel cord. It is important for pushing-off and standing on your toes and is important for walking, running, or jumping. Tendinitis is often caused by overuse and repetitive microtrauma. SYMPTOMS  Pain, tenderness, swelling, warmth, and redness may occur over the Achilles tendon even at rest.  Pain with pushing off, or flexing or extending the ankle.  Pain that is worsened after or during activity. CAUSES   Overuse sometimes seen with rapid increase in exercise programs or in sports requiring running and jumping.  Poor physical conditioning (strength and flexibility or endurance).  Running sports, especially training running down hills.  Inadequate warm-up before practice or play or failure to stretch before participation.  Injury to the tendon. PREVENTION   Warm up and stretch before practice or competition.  Allow time for adequate rest and recovery between practices and competition.  Keep up conditioning.  Keep up ankle and leg flexibility.  Improve or keep muscle strength and endurance.  Improve cardiovascular fitness.  Use proper technique.  Use proper equipment (shoes, skates).  To help prevent recurrence, taping, protective strapping, or an adhesive bandage may be recommended for several weeks after healing is complete. PROGNOSIS   Recovery may take weeks to several months to heal.  Longer recovery is expected if symptoms have been prolonged.  Recovery is usually quicker if the inflammation is due to a direct blow as compared with overuse or sudden strain. RELATED COMPLICATIONS   Healing time will be prolonged if the condition is not correctly treated. The injury must be given plenty of time to heal.  Symptoms can reoccur if  activity is resumed too soon.  Untreated, tendinitis may increase the risk of tendon rupture requiring additional time for recovery and possibly surgery. TREATMENT   The first treatment consists of rest anti-inflammatory medication, and ice to relieve the pain.  Stretching and strengthening exercises after resolution of pain will likely help reduce the risk of recurrence. Referral to a physical therapist or athletic trainer for further evaluation and treatment may be helpful.  A walking boot or cast may be recommended to rest the Achilles tendon. This can help break the cycle of inflammation and microtrauma.  Arch supports (orthotics) may be prescribed or recommended by your caregiver as an adjunct to therapy and rest.  Surgery to remove the inflamed tendon lining or degenerated tendon tissue is rarely necessary and has shown less than predictable results. MEDICATION   Nonsteroidal anti-inflammatory medications, such as aspirin and ibuprofen, may be used for pain and inflammation relief. Do not take within 7 days before surgery. Take these as directed by your caregiver. Contact your caregiver immediately if any bleeding, stomach upset, or signs of allergic reaction occur. Other minor pain relievers, such as acetaminophen, may also be used.  Pain relievers may be prescribed as necessary by your caregiver. Do not take prescription pain medication for longer than 4 to 7 days. Use only as directed and only as much as you need.  Cortisone injections are rarely indicated. Cortisone injections may weaken tendons and predispose to rupture. It is better to give the condition more time to heal than to use them. HEAT AND COLD  Cold is used to relieve pain and reduce inflammation for acute and chronic Achilles tendinitis. Cold should be applied for 10 to 15 minutes   every 2 to 3 hours for inflammation and pain and immediately after any activity that aggravates your symptoms. Use ice packs or an ice  massage.  Heat may be used before performing stretching and strengthening activities prescribed by your caregiver. Use a heat pack or a warm soak. SEEK MEDICAL CARE IF:  Symptoms get worse or do not improve in 2 weeks despite treatment.  New, unexplained symptoms develop. Drugs used in treatment may produce side effects.  EXERCISES:  RANGE OF MOTION (ROM) AND STRETCHING EXERCISES - Achilles Tendinitis  These exercises may help you when beginning to rehabilitate your injury. Your symptoms may resolve with or without further involvement from your physician, physical therapist or athletic trainer. While completing these exercises, remember:   Restoring tissue flexibility helps normal motion to return to the joints. This allows healthier, less painful movement and activity.  An effective stretch should be held for at least 30 seconds.  A stretch should never be painful. You should only feel a gentle lengthening or release in the stretched tissue.  STRETCH  Gastroc, Standing   Place hands on wall.  Extend right / left leg, keeping the front knee somewhat bent.  Slightly point your toes inward on your back foot.  Keeping your right / left heel on the floor and your knee straight, shift your weight toward the wall, not allowing your back to arch.  You should feel a gentle stretch in the right / left calf. Hold this position for 10 seconds. Repeat 3 times. Complete this stretch 2 times per day.  STRETCH  Soleus, Standing   Place hands on wall.  Extend right / left leg, keeping the other knee somewhat bent.  Slightly point your toes inward on your back foot.  Keep your right / left heel on the floor, bend your back knee, and slightly shift your weight over the back leg so that you feel a gentle stretch deep in your back calf.  Hold this position for 10 seconds. Repeat 3 times. Complete this stretch 2 times per day.  STRETCH  Gastrocsoleus, Standing  Note: This exercise can place  a lot of stress on your foot and ankle. Please complete this exercise only if specifically instructed by your caregiver.   Place the ball of your right / left foot on a step, keeping your other foot firmly on the same step.  Hold on to the wall or a rail for balance.  Slowly lift your other foot, allowing your body weight to press your heel down over the edge of the step.  You should feel a stretch in your right / left calf.  Hold this position for 10 seconds.  Repeat this exercise with a slight bend in your knee. Repeat 3 times. Complete this stretch 2 times per day.   STRENGTHENING EXERCISES - Achilles Tendinitis These exercises may help you when beginning to rehabilitate your injury. They may resolve your symptoms with or without further involvement from your physician, physical therapist or athletic trainer. While completing these exercises, remember:   Muscles can gain both the endurance and the strength needed for everyday activities through controlled exercises.  Complete these exercises as instructed by your physician, physical therapist or athletic trainer. Progress the resistance and repetitions only as guided.  You may experience muscle soreness or fatigue, but the pain or discomfort you are trying to eliminate should never worsen during these exercises. If this pain does worsen, stop and make certain you are following the directions exactly. If   the pain is still present after adjustments, discontinue the exercise until you can discuss the trouble with your clinician.  STRENGTH - Plantar-flexors   Sit with your right / left leg extended. Holding onto both ends of a rubber exercise band/tubing, loop it around the ball of your foot. Keep a slight tension in the band.  Slowly push your toes away from you, pointing them downward.  Hold this position for 10 seconds. Return slowly, controlling the tension in the band/tubing. Repeat 3 times. Complete this exercise 2 times per day.     STRENGTH - Plantar-flexors   Stand with your feet shoulder width apart. Steady yourself with a wall or table using as little support as needed.  Keeping your weight evenly spread over the width of your feet, rise up on your toes.*  Hold this position for 10 seconds. Repeat 3 times. Complete this exercise 2 times per day.  *If this is too easy, shift your weight toward your right / left leg until you feel challenged. Ultimately, you may be asked to do this exercise with your right / left foot only.  STRENGTH  Plantar-flexors, Eccentric  Note: This exercise can place a lot of stress on your foot and ankle. Please complete this exercise only if specifically instructed by your caregiver.   Place the balls of your feet on a step. With your hands, use only enough support from a wall or rail to keep your balance.  Keep your knees straight and rise up on your toes.  Slowly shift your weight entirely to your right / left toes and pick up your opposite foot. Gently and with controlled movement, lower your weight through your right / left foot so that your heel drops below the level of the step. You will feel a slight stretch in the back of your calf at the end position.  Use the healthy leg to help rise up onto the balls of both feet, then lower weight only on the right / left leg again. Build up to 15 repetitions. Then progress to 3 consecutive sets of 15 repetitions.*  After completing the above exercise, complete the same exercise with a slight knee bend (about 30 degrees). Again, build up to 15 repetitions. Then progress to 3 consecutive sets of 15 repetitions.* Perform this exercise 2 times per day.  *When you easily complete 3 sets of 15, your physician, physical therapist or athletic trainer may advise you to add resistance by wearing a backpack filled with additional weight.  STRENGTH - Plantar Flexors, Seated   Sit on a chair that allows your feet to rest flat on the ground. If  necessary, sit at the edge of the chair.  Keeping your toes firmly on the ground, lift your right / left heel as far as you can without increasing any discomfort in your ankle. Repeat 3 times. Complete this exercise 2 times a day.   Please take 3 advil 90 minutes prior to activity and 2 advil 90 minutes after activity

## 2015-11-20 ENCOUNTER — Telehealth: Payer: Self-pay | Admitting: Family Medicine

## 2015-11-20 NOTE — Telephone Encounter (Signed)
Patient stated he need a medical waiver for his prescription Adderall XR 30 MG. He need this sent to [facility]. Patient stated Manson Passey is going to fax over a form for Dr. Tamala Julian to complete. Patient stated they faxed over the form yesterday and again today. Patient stated he need this done as soon as possible. His prescription ran out two days ago. [phone]. Please locate the form and contact patient when this is done. Thank you. Delos Haring

## 2015-11-20 NOTE — Telephone Encounter (Signed)
Prior auth?

## 2015-11-20 NOTE — Telephone Encounter (Signed)
Received form. Completed and faxed back to [facility] w/confirmation. PA pending.

## 2015-11-22 NOTE — Telephone Encounter (Signed)
PA approved through 11/20/16. Notified pt who had already checked w/pharm and p/up Rx.

## 2016-01-17 ENCOUNTER — Other Ambulatory Visit: Payer: Self-pay

## 2016-01-17 DIAGNOSIS — F988 Other specified behavioral and emotional disorders with onset usually occurring in childhood and adolescence: Secondary | ICD-10-CM

## 2016-01-20 ENCOUNTER — Telehealth: Payer: Self-pay | Admitting: Emergency Medicine

## 2016-01-20 MED ORDER — AMPHETAMINE-DEXTROAMPHET ER 30 MG PO CP24: 30.0000 mg | ORAL_CAPSULE | ORAL | Status: DC

## 2016-01-20 NOTE — Telephone Encounter (Signed)
Notified pt medication Rx Adderall XR ready for pick up

## 2016-01-20 NOTE — Telephone Encounter (Signed)
Call --- rx x 3 months ready for pick up.

## 2016-04-14 ENCOUNTER — Telehealth: Payer: Self-pay

## 2016-04-14 NOTE — Telephone Encounter (Signed)
Pt needs a refill on Adderall XR. He is not sure if a visit is needed or not.  Please advise  [phone]

## 2016-04-15 NOTE — Telephone Encounter (Signed)
Spoke with patient he is going to call and get an appointment this week.  Explained new process.  Patient understood

## 2016-04-16 NOTE — Telephone Encounter (Signed)
Pt is looking to come in for fill but Dr. Tamala Julian is out of office until/// 04/22/16 but he will be out of his rx prior to then... Pt plans to call @ 4 for 08/04 scheduling.  Please call if another provider will be able to fill Add XR or if he will have to exhaust all doses and wait for same day with Dr. Tamala Julian.  [phone]

## 2016-04-17 ENCOUNTER — Ambulatory Visit (INDEPENDENT_AMBULATORY_CARE_PROVIDER_SITE_OTHER): Payer: BLUE CROSS/BLUE SHIELD | Admitting: Family Medicine

## 2016-04-17 VITALS — BP 122/92 | HR 79 | Temp 97.9°F | Resp 16 | Ht 68.0 in | Wt 242.2 lb

## 2016-04-17 DIAGNOSIS — D229 Melanocytic nevi, unspecified: Secondary | ICD-10-CM

## 2016-04-17 DIAGNOSIS — F909 Attention-deficit hyperactivity disorder, unspecified type: Secondary | ICD-10-CM

## 2016-04-17 DIAGNOSIS — F988 Other specified behavioral and emotional disorders with onset usually occurring in childhood and adolescence: Secondary | ICD-10-CM

## 2016-04-17 DIAGNOSIS — D239 Other benign neoplasm of skin, unspecified: Secondary | ICD-10-CM | POA: Insufficient documentation

## 2016-04-17 MED ORDER — AMPHETAMINE-DEXTROAMPHET ER 30 MG PO CP24: 30.0000 mg | ORAL_CAPSULE | ORAL | 0 refills | Status: DC

## 2016-04-17 MED ORDER — AMPHETAMINE-DEXTROAMPHET ER 30 MG PO CP24: 30.0000 mg | ORAL_CAPSULE | Freq: Every day | ORAL | 0 refills | Status: DC

## 2016-04-17 NOTE — Telephone Encounter (Signed)
Patient will need to see Dr. Tamala Julian for refills. There is no health risk with cessation so it is best he get back in with her for refills. Philis Fendt, MS, PA-C 11:20 AM, 04/17/2016

## 2016-04-17 NOTE — Patient Instructions (Addendum)
  Thank you for coming in today. Return in 3 months or so with Dr Tamala Julian.  Continue Adderall.  That little blue bump looks like a Blue Nevus to me. We will keep an eye on it and cut it out if it misbehaves or changes.         IF you received an x-ray today, you will receive an invoice from [facility]. Please contact [facility] at [phone] with questions or concerns regarding your invoice.   IF you received labwork today, you will receive an invoice from [employer]. Please contact [facility] at [phone] with questions or concerns regarding your invoice.   Our billing staff will not be able to assist you with questions regarding bills from these companies.  You will be contacted with the lab results as soon as they are available. The fastest way to get your results is to activate your My Chart account. Instructions are located on the last page of this paperwork. If you have not heard from [place] regarding the results in 2 weeks, please contact this office.

## 2016-04-17 NOTE — Progress Notes (Signed)
    Matthew Hines is a 30 y.o. male who presents to [facility] today for refill of Adderall for ADHD and discuss skin lesion. Patient is feeling well with Adderall with no issues. No tics or anxiety chest pains palpitations shortness of breath. He notes the medication helps him concentrate at work.  Small skin lesion on his left abdomen. This is been present for years and has not changed at all. It is not painful or itchy or discharging.   Past Medical History:  Diagnosis Date  . ADD (attention deficit disorder)   . OCD (obsessive compulsive disorder)    Past Surgical History:  Procedure Laterality Date  . EYE SURGERY    . HERNIA REPAIR    . TYMPANOSTOMY TUBE PLACEMENT     Social History  Substance Use Topics  . Smoking status: Current Every Day Smoker    Packs/day: 0.25    Years: 11.00    Types: Cigarettes  . Smokeless tobacco: Never Used  . Alcohol use 0.6 - 1.2 oz/week    1 - 2 Standard drinks or equivalent per week   ROS as above Medications: Current Outpatient Prescriptions  Medication Sig Dispense Refill  . amphetamine-dextroamphetamine (ADDERALL XR) 30 MG 24 hr capsule Take 1 capsule (30 mg total) by mouth every morning. Fill now 30 capsule 0  . amphetamine-dextroamphetamine (ADDERALL XR) 30 MG 24 hr capsule Take 1 capsule (30 mg total) by mouth daily. Fill in 30 days 30 capsule 0  . amphetamine-dextroamphetamine (ADDERALL XR) 30 MG 24 hr capsule Take 1 capsule (30 mg total) by mouth every morning. Fill in 60 days 30 capsule 0   No current facility-administered medications for this visit.    Allergies  Allergen Reactions  . Amoxicillin Rash  . Penicillins Rash     Exam:  BP (!) 122/92 (BP Location: Left Arm, Patient Position: Sitting, Cuff Size: Large)   Pulse 79   Temp 97.9 F (36.6 C) (Oral)   Resp 16   Ht 5\' 8"  (1.727 m)   Wt 242 lb 3.2 oz (109.9 kg)   SpO2 99%   BMI 36.83 kg/m  Gen: Well NAD HEENT: EOMI,  MMM Lungs: Normal work of breathing.  CTABL Heart: RRR no MRG Abd: NABS, Soft. Nondistended, Nontender Exts: Brisk capillary refill, warm and well perfused.  Skin: Tiny dark blue pigmented papule left anterior abdomen Psych: Alert and oriented normal speech thought process and affect. No tremor. No tics noted.  No results found for this or any previous visit (from the past 24 hour(s)). No results found.  Assessment and Plan: 30 y.o. male with  ADHD: Doing well. Continue Adderall. 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 Plan for watchful waiting  Discussed warning signs or symptoms. Please see discharge instructions. Patient expresses understanding.

## 2016-07-16 ENCOUNTER — Telehealth: Payer: Self-pay

## 2016-07-16 NOTE — Telephone Encounter (Signed)
Pt called to see if he can get another Rx for Adderall or get in to see Dr Tamala Julian, his regular MD who manages his Adderall. Explained controlled subst policy and Dr Thompson Caul schedule. Pt is working some in [place] also which makes it harder to get in, but will call back this Sat to sch appt for Sat 11th. He stated he will use what he has sparingly because he won't be in town to pick up a Rx after Dr Tamala Julian returns next week.

## 2016-07-25 ENCOUNTER — Ambulatory Visit (INDEPENDENT_AMBULATORY_CARE_PROVIDER_SITE_OTHER): Payer: BLUE CROSS/BLUE SHIELD | Admitting: Family Medicine

## 2016-07-25 VITALS — BP 122/80 | HR 81 | Temp 98.1°F | Resp 18 | Ht 68.0 in | Wt 244.0 lb

## 2016-07-25 DIAGNOSIS — S335XXA Sprain of ligaments of lumbar spine, initial encounter: Secondary | ICD-10-CM | POA: Diagnosis not present

## 2016-07-25 DIAGNOSIS — F988 Other specified behavioral and emotional disorders with onset usually occurring in childhood and adolescence: Secondary | ICD-10-CM

## 2016-07-25 MED ORDER — AMPHETAMINE-DEXTROAMPHET ER 30 MG PO CP24: 30.0000 mg | ORAL_CAPSULE | ORAL | 0 refills | Status: DC

## 2016-07-25 MED ORDER — AMPHETAMINE-DEXTROAMPHET ER 30 MG PO CP24: 30.0000 mg | ORAL_CAPSULE | Freq: Every day | ORAL | 0 refills | Status: DC

## 2016-07-25 NOTE — Patient Instructions (Addendum)
   IF you received an x-ray today, you will receive an invoice from [facility]. Please contact [facility] at [phone] with questions or concerns regarding your invoice.   IF you received labwork today, you will receive an invoice from [facility]. Please contact [facility] at [phone] with questions or concerns regarding your invoice.   Our billing staff will not be able to assist you with questions regarding bills from these companies.  You will be contacted with the lab results as soon as they are available. The fastest way to get your results is to activate your My Chart account. Instructions are located on the last page of this paperwork. If you have not heard from us regarding the results in 2 weeks, please contact this office.     Low Back Sprain With Rehab A sprain is an injury in which a ligament is torn. The ligaments of the lower back are vulnerable to sprains. However, they are strong and require great force to be injured. These ligaments are important for stabilizing the spinal column. Sprains are classified into three categories. Grade 1 sprains cause pain, but the tendon is not lengthened. Grade 2 sprains include a lengthened ligament, due to the ligament being stretched or partially ruptured. With grade 2 sprains there is still function, although the function may be decreased. Grade 3 sprains involve a complete tear of the tendon or muscle, and function is usually impaired. SYMPTOMS   Severe pain in the lower back.  Sometimes, a feeling of a "pop," "snap," or tear, at the time of injury.  Tenderness and sometimes swelling at the injury site.  Uncommonly, bruising (contusion) within 48 hours of injury.  Muscle spasms in the back. CAUSES  Low back sprains occur when a force is placed on the ligaments that is greater than they can handle. Common causes of injury include:  Performing a stressful act while  off-balance.  Repetitive stressful activities that involve movement of the lower back.  Direct hit (trauma) to the lower back. RISK INCREASES WITH:  Contact sports (football, wrestling).  Collisions (major skiing accidents).  Sports that require throwing or lifting (baseball, weightlifting).  Sports involving twisting of the spine (gymnastics, diving, tennis, golf).  Poor strength and flexibility.  Inadequate protection.  Previous back injury or surgery (especially fusion). PREVENTION  Wear properly fitted and padded protective equipment.  Warm up and stretch properly before activity.  Allow for adequate recovery between workouts.  Maintain physical fitness:  Strength, flexibility, and endurance.  Cardiovascular fitness.  Maintain a healthy body weight. PROGNOSIS  If treated properly, low back sprains usually heal with non-surgical treatment. The length of time for healing depends on the severity of the injury.  RELATED COMPLICATIONS   Recurring symptoms, resulting in a chronic problem.  Chronic inflammation and pain in the low back.  Delayed healing or resolution of symptoms, especially if activity is resumed too soon.  Prolonged impairment.  Unstable or arthritic joints of the low back. TREATMENT  Treatment first involves the use of ice and medicine, to reduce pain and inflammation. The use of strengthening and stretching exercises may help reduce pain with activity. These exercises may be performed at home or with a therapist. Severe injuries may require referral to a therapist for further evaluation and treatment, such as ultrasound. Your caregiver may advise that you wear a back brace or corset, to help reduce pain and discomfort. Often, prolonged bed rest results in greater harm then benefit. Corticosteroid injections may   be recommended. However, these should be reserved for the most serious cases. It is important to avoid using your back when lifting objects.  At night, sleep on your back on a firm mattress, with a pillow placed under your knees. If non-surgical treatment is unsuccessful, surgery may be needed.  MEDICATION   If pain medicine is needed, nonsteroidal anti-inflammatory medicines (aspirin and ibuprofen), or other minor pain relievers (acetaminophen), are often advised.  Do not take pain medicine for 7 days before surgery.  Prescription pain relievers may be given, if your caregiver thinks they are needed. Use only as directed and only as much as you need.  Ointments applied to the skin may be helpful.  Corticosteroid injections may be given by your caregiver. These injections should be reserved for the most serious cases, because they may only be given a certain number of times. HEAT AND COLD  Cold treatment (icing) should be applied for 10 to 15 minutes every 2 to 3 hours for inflammation and pain, and immediately after activity that aggravates your symptoms. Use ice packs or an ice massage.  Heat treatment may be used before performing stretching and strengthening activities prescribed by your caregiver, physical therapist, or athletic trainer. Use a heat pack or a warm water soak. SEEK MEDICAL CARE IF:   Symptoms get worse or do not improve in 2 to 4 weeks, despite treatment.  You develop numbness or weakness in either leg.  You lose bowel or bladder function.  Any of the following occur after surgery: fever, increased pain, swelling, redness, drainage of fluids, or bleeding in the affected area.  New, unexplained symptoms develop. (Drugs used in treatment may produce side effects.) EXERCISES  RANGE OF MOTION (ROM) AND STRETCHING EXERCISES - Low Back Sprain Most people with lower back pain will find that their symptoms get worse with excessive bending forward (flexion) or arching at the lower back (extension). The exercises that will help resolve your symptoms will focus on the opposite motion.  Your physician, physical  therapist or athletic trainer will help you determine which exercises will be most helpful to resolve your lower back pain. Do not complete any exercises without first consulting with your caregiver. Discontinue any exercises which make your symptoms worse, until you speak to your caregiver. If you have pain, numbness or tingling which travels down into your buttocks, leg or foot, the goal of the therapy is for these symptoms to move closer to your back and eventually resolve. Sometimes, these leg symptoms will get better, but your lower back pain may worsen. This is often an indication of progress in your rehabilitation. Be very alert to any changes in your symptoms and the activities in which you participated in the 24 hours prior to the change. Sharing this information with your caregiver will allow him or her to most efficiently treat your condition. These exercises may help you when beginning to rehabilitate your injury. Your symptoms may resolve with or without further involvement from your physician, physical therapist or athletic trainer. While completing these exercises, remember:   Restoring tissue flexibility helps normal motion to return to the joints. This allows healthier, less painful movement and activity.  An effective stretch should be held for at least 30 seconds.  A stretch should never be painful. You should only feel a gentle lengthening or release in the stretched tissue. FLEXION RANGE OF MOTION AND STRETCHING EXERCISES: STRETCH - Flexion, Single Knee to Chest   Lie on a firm bed or floor with   both legs extended in front of you.  Keeping one leg in contact with the floor, bring your opposite knee to your chest. Hold your leg in place by either grabbing behind your thigh or at your knee.  Pull until you feel a gentle stretch in your low back. Hold __________ seconds.  Slowly release your grasp and repeat the exercise with the opposite side. Repeat __________ times. Complete  this exercise __________ times per day.  STRETCH - Flexion, Double Knee to Chest  Lie on a firm bed or floor with both legs extended in front of you.  Keeping one leg in contact with the floor, bring your opposite knee to your chest.  Tense your stomach muscles to support your back and then lift your other knee to your chest. Hold your legs in place by either grabbing behind your thighs or at your knees.  Pull both knees toward your chest until you feel a gentle stretch in your low back. Hold __________ seconds.  Tense your stomach muscles and slowly return one leg at a time to the floor. Repeat __________ times. Complete this exercise __________ times per day.  STRETCH - Low Trunk Rotation  Lie on a firm bed or floor. Keeping your legs in front of you, bend your knees so they are both pointed toward the ceiling and your feet are flat on the floor.  Extend your arms out to the side. This will stabilize your upper body by keeping your shoulders in contact with the floor.  Gently and slowly drop both knees together to one side until you feel a gentle stretch in your low back. Hold for __________ seconds.  Tense your stomach muscles to support your lower back as you bring your knees back to the starting position. Repeat the exercise to the other side. Repeat __________ times. Complete this exercise __________ times per day  EXTENSION RANGE OF MOTION AND FLEXIBILITY EXERCISES: STRETCH - Extension, Prone on Elbows   Lie on your stomach on the floor, a bed will be too soft. Place your palms about shoulder width apart and at the height of your head.  Place your elbows under your shoulders. If this is too painful, stack pillows under your chest.  Allow your body to relax so that your hips drop lower and make contact more completely with the floor.  Hold this position for __________ seconds.  Slowly return to lying flat on the floor. Repeat __________ times. Complete this exercise  __________ times per day.  RANGE OF MOTION - Extension, Prone Press Ups  Lie on your stomach on the floor, a bed will be too soft. Place your palms about shoulder width apart and at the height of your head.  Keeping your back as relaxed as possible, slowly straighten your elbows while keeping your hips on the floor. You may adjust the placement of your hands to maximize your comfort. As you gain motion, your hands will come more underneath your shoulders.  Hold this position __________ seconds.  Slowly return to lying flat on the floor. Repeat __________ times. Complete this exercise __________ times per day.  RANGE OF MOTION- Quadruped, Neutral Spine   Assume a hands and knees position on a firm surface. Keep your hands under your shoulders and your knees under your hips. You may place padding under your knees for comfort.  Drop your head and point your tailbone toward the ground below you. This will round out your lower back like an angry cat. Hold this position   for __________ seconds.  Slowly lift your head and release your tail bone so that your back sags into a large arch, like an old horse.  Hold this position for __________ seconds.  Repeat this until you feel limber in your low back.  Now, find your "sweet spot." This will be the most comfortable position somewhere between the two previous positions. This is your neutral spine. Once you have found this position, tense your stomach muscles to support your low back.  Hold this position for __________ seconds. Repeat __________ times. Complete this exercise __________ times per day.  STRENGTHENING EXERCISES - Low Back Sprain These exercises may help you when beginning to rehabilitate your injury. These exercises should be done near your "sweet spot." This is the neutral, low-back arch, somewhere between fully rounded and fully arched, that is your least painful position. When performed in this safe range of motion, these exercises  can be used for people who have either a flexion or extension based injury. These exercises may resolve your symptoms with or without further involvement from your physician, physical therapist or athletic trainer. While completing these exercises, remember:   Muscles can gain both the endurance and the strength needed for everyday activities through controlled exercises.  Complete these exercises as instructed by your physician, physical therapist or athletic trainer. Increase the resistance and repetitions only as guided.  You may experience muscle soreness or fatigue, but the pain or discomfort you are trying to eliminate should never worsen during these exercises. If this pain does worsen, stop and make certain you are following the directions exactly. If the pain is still present after adjustments, discontinue the exercise until you can discuss the trouble with your caregiver. STRENGTHENING - Deep Abdominals, Pelvic Tilt   Lie on a firm bed or floor. Keeping your legs in front of you, bend your knees so they are both pointed toward the ceiling and your feet are flat on the floor.  Tense your lower abdominal muscles to press your low back into the floor. This motion will rotate your pelvis so that your tail bone is scooping upwards rather than pointing at your feet or into the floor. With a gentle tension and even breathing, hold this position for __________ seconds. Repeat __________ times. Complete this exercise __________ times per day.  STRENGTHENING - Abdominals, Crunches   Lie on a firm bed or floor. Keeping your legs in front of you, bend your knees so they are both pointed toward the ceiling and your feet are flat on the floor. Cross your arms over your chest.  Slightly tip your chin down without bending your neck.  Tense your abdominals and slowly lift your trunk high enough to just clear your shoulder blades. Lifting higher can put excessive stress on the lower back and does not  further strengthen your abdominal muscles.  Control your return to the starting position. Repeat __________ times. Complete this exercise __________ times per day.  STRENGTHENING - Quadruped, Opposite UE/LE Lift   Assume a hands and knees position on a firm surface. Keep your hands under your shoulders and your knees under your hips. You may place padding under your knees for comfort.  Find your neutral spine and gently tense your abdominal muscles so that you can maintain this position. Your shoulders and hips should form a rectangle that is parallel with the floor and is not twisted.  Keeping your trunk steady, lift your right hand no higher than your shoulder and then your left   leg no higher than your hip. Make sure you are not holding your breath. Hold this position for __________ seconds.  Continuing to keep your abdominal muscles tense and your back steady, slowly return to your starting position. Repeat with the opposite arm and leg. Repeat __________ times. Complete this exercise __________ times per day.  STRENGTHENING - Abdominals and Quadriceps, Straight Leg Raise   Lie on a firm bed or floor with both legs extended in front of you.  Keeping one leg in contact with the floor, bend the other knee so that your foot can rest flat on the floor.  Find your neutral spine, and tense your abdominal muscles to maintain your spinal position throughout the exercise.  Slowly lift your straight leg off the floor about 6 inches for a count of 15, making sure to not hold your breath.  Still keeping your neutral spine, slowly lower your leg all the way to the floor. Repeat this exercise with each leg __________ times. Complete this exercise __________ times per day. POSTURE AND BODY MECHANICS CONSIDERATIONS - Low Back Sprain Keeping correct posture when sitting, standing or completing your activities will reduce the stress put on different body tissues, allowing injured tissues a chance to heal  and limiting painful experiences. The following are general guidelines for improved posture. Your physician or physical therapist will provide you with any instructions specific to your needs. While reading these guidelines, remember:  The exercises prescribed by your provider will help you have the flexibility and strength to maintain correct postures.  The correct posture provides the best environment for your joints to work. All of your joints have less wear and tear when properly supported by a spine with good posture. This means you will experience a healthier, less painful body.  Correct posture must be practiced with all of your activities, especially prolonged sitting and standing. Correct posture is as important when doing repetitive low-stress activities (typing) as it is when doing a single heavy-load activity (lifting). RESTING POSITIONS Consider which positions are most painful for you when choosing a resting position. If you have pain with flexion-based activities (sitting, bending, stooping, squatting), choose a position that allows you to rest in a less flexed posture. You would want to avoid curling into a fetal position on your side. If your pain worsens with extension-based activities (prolonged standing, working overhead), avoid resting in an extended position such as sleeping on your stomach. Most people will find more comfort when they rest with their spine in a more neutral position, neither too rounded nor too arched. Lying on a non-sagging bed on your side with a pillow between your knees, or on your back with a pillow under your knees will often provide some relief. Keep in mind, being in any one position for a prolonged period of time, no matter how correct your posture, can still lead to stiffness. PROPER SITTING POSTURE In order to minimize stress and discomfort on your spine, you must sit with correct posture. Sitting with good posture should be effortless for a healthy body.  Returning to good posture is a gradual process. Many people can work toward this most comfortably by using various supports until they have the flexibility and strength to maintain this posture on their own. When sitting with proper posture, your ears will fall over your shoulders and your shoulders will fall over your hips. You should use the back of the chair to support your upper back. Your lower back will be in a neutral   position, just slightly arched. You may place a small pillow or folded towel at the base of your lower back for  support.  When working at a desk, create an environment that supports good, upright posture. Without extra support, muscles tire, which leads to excessive strain on joints and other tissues. Keep these recommendations in mind: CHAIR:  A chair should be able to slide under your desk when your back makes contact with the back of the chair. This allows you to work closely.  The chair's height should allow your eyes to be level with the upper part of your monitor and your hands to be slightly lower than your elbows. BODY POSITION  Your feet should make contact with the floor. If this is not possible, use a foot rest.  Keep your ears over your shoulders. This will reduce stress on your neck and low back. INCORRECT SITTING POSTURES  If you are feeling tired and unable to assume a healthy sitting posture, do not slouch or slump. This puts excessive strain on your back tissues, causing more damage and pain. Healthier options include:  Using more support, like a lumbar pillow.  Switching tasks to something that requires you to be upright or walking.  Talking a brief walk.  Lying down to rest in a neutral-spine position. PROLONGED STANDING WHILE SLIGHTLY LEANING FORWARD  When completing a task that requires you to lean forward while standing in one place for a long time, place either foot up on a stationary 2-4 inch high object to help maintain the best posture. When  both feet are on the ground, the lower back tends to lose its slight inward curve. If this curve flattens (or becomes too large), then the back and your other joints will experience too much stress, tire more quickly, and can cause pain. CORRECT STANDING POSTURES Proper standing posture should be assumed with all daily activities, even if they only take a few moments, like when brushing your teeth. As in sitting, your ears should fall over your shoulders and your shoulders should fall over your hips. You should keep a slight tension in your abdominal muscles to brace your spine. Your tailbone should point down to the ground, not behind your body, resulting in an over-extended swayback posture.  INCORRECT STANDING POSTURES  Common incorrect standing postures include a forward head, locked knees and/or an excessive swayback. WALKING Walk with an upright posture. Your ears, shoulders and hips should all line-up. PROLONGED ACTIVITY IN A FLEXED POSITION When completing a task that requires you to bend forward at your waist or lean over a low surface, try to find a way to stabilize 3 out of 4 of your limbs. You can place a hand or elbow on your thigh or rest a knee on the surface you are reaching across. This will provide you more stability, so that your muscles do not tire as quickly. By keeping your knees relaxed, or slightly bent, you will also reduce stress across your lower back. CORRECT LIFTING TECHNIQUES DO :  Assume a wide stance. This will provide you more stability and the opportunity to get as close as possible to the object which you are lifting.  Tense your abdominals to brace your spine. Bend at the knees and hips. Keeping your back locked in a neutral-spine position, lift using your leg muscles. Lift with your legs, keeping your back straight.  Test the weight of unknown objects before attempting to lift them.  Try to keep your elbows locked down   at your sides in order get the best  strength from your shoulders when carrying an object.  Always ask for help when lifting heavy or awkward objects. INCORRECT LIFTING TECHNIQUES DO NOT:   Lock your knees when lifting, even if it is a small object.  Bend and twist. Pivot at your feet or move your feet when needing to change directions.  Assume that you can safely pick up even a paperclip without proper posture.   This information is not intended to replace advice given to you by your health care provider. Make sure you discuss any questions you have with your health care provider.   Document Released: 08/31/2005 Document Revised: 09/21/2014 Document Reviewed: 12/13/2008 [employer] Interactive Patient Education 2016 [employer]  

## 2016-07-25 NOTE — Progress Notes (Signed)
Subjective:    Patient ID: Matthew Hines, male    DOB: 1986/06/06, 30 y.o.   MRN: [id number]  07/25/2016  Medication Refill (Adderall)   HPI This 30 y.o. male presents for eight month follow-up for ADD.  Patient reports good compliance with medication, good tolerance to medication, and good symptom control.    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  Also has a fishing trip planned in upcoming months.  Will be traveling home for Thanksgiving.  Closing on a house in December. Doing well emotionally; denies depressive or anxiety symptoms.    Low back pain: onset in past week; was in [place] for a conference last week; wakes up with B lower back pain daily; denies radiation into legs; denies saddle paresthesias; denies bowel or bladder dysfunction.  Realizes that obesity also contributing. Also having some upper back pain B; no radiation into arms; no n/t/w in arms or legs.    BP Readings from Last 3 Encounters:  07/25/16 122/80  04/17/16 (!) 122/92  10/31/15 130/87   Wt Readings from Last 3 Encounters:  07/25/16 244 lb (110.7 kg)  04/17/16 242 lb 3.2 oz (109.9 kg)  10/31/15 234 lb (106.1 kg)    [place] Controlled Substance Registry:  10/7 Adderall '30mg'$  Neo Yepiz 05/16/16 Adderall '30mg'$  Lynne Leader 04/17/16 Adderal '30mg'$  Lynne Leader 03/16/16 Adderall Reginia Forts 02/17/16 Adderall '30mg'$  Daemien Fronczak   There is no immunization history on file for this patient.  Review of Systems  Constitutional: Negative for activity change, appetite change, chills, diaphoresis, fatigue and fever.  Eyes: Negative for visual disturbance.  Respiratory: Negative for cough and shortness of breath.   Cardiovascular: Negative for chest pain, palpitations and leg swelling.  Endocrine: Negative for cold intolerance, heat intolerance, polydipsia, polyphagia and polyuria.  Musculoskeletal: Positive for back pain, myalgias and neck pain.  Neurological:  Negative for dizziness, tremors, seizures, syncope, facial asymmetry, speech difficulty, weakness, light-headedness, numbness and headaches.  Psychiatric/Behavioral: Positive for decreased concentration. Negative for dysphoric mood, self-injury, sleep disturbance and suicidal ideas. The patient is not nervous/anxious and is not hyperactive.     Past Medical History:  Diagnosis Date  . ADD (attention deficit disorder)   . OCD (obsessive compulsive disorder)    Past Surgical History:  Procedure Laterality Date  . EYE SURGERY    . HERNIA REPAIR    . TYMPANOSTOMY TUBE PLACEMENT     Allergies  Allergen Reactions  . Amoxicillin Rash  . Penicillins Rash   Current Outpatient Prescriptions  Medication Sig Dispense Refill  . amphetamine-dextroamphetamine (ADDERALL XR) 30 MG 24 hr capsule Take 1 capsule (30 mg total) by mouth every morning. Fill now 30 capsule 0  . amphetamine-dextroamphetamine (ADDERALL XR) 30 MG 24 hr capsule Take 1 capsule (30 mg total) by mouth daily. Fill in 30 days 30 capsule 0  . amphetamine-dextroamphetamine (ADDERALL XR) 30 MG 24 hr capsule Take 1 capsule (30 mg total) by mouth every morning. Fill in 60 days 30 capsule 0   No current facility-administered medications for this visit.    Social History   Social History  . Marital status: Single    Spouse name: N/A  . Number of children: N/A  . Years of education: N/A   Occupational History  . Not on file.   Social History Main Topics  . Smoking status: Current Every Day Smoker    Packs/day: 0.25    Years: 11.00    Types: Cigarettes  .  Smokeless tobacco: Never Used  . Alcohol use 0.6 - 1.2 oz/week    1 - 2 Standard drinks or equivalent per week  . Drug use: No  . Sexual activity: Yes   Other Topics Concern  . 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  Last STD screening in college.    Dates females only.  Condoms most of time.    Family History  Problem Relation Age of Onset  . Heart disease Maternal Grandmother   . Stroke Maternal Grandmother   . Heart disease Maternal Grandfather   . Obesity Mother        Objective:    BP 122/80   Pulse 81   Temp 98.1 F (36.7 C) (Oral)   Resp 18   Ht '5\' 8"'$  (1.727 m)   Wt 244 lb (110.7 kg)   SpO2 99%   BMI 37.10 kg/m  Physical Exam  Constitutional: He is oriented to person, place, and time. He appears well-developed and well-nourished. No distress.  HENT:  Head: Normocephalic and atraumatic.  Right Ear: External ear normal.  Left Ear: External ear normal.  Nose: Nose normal.  Mouth/Throat: Oropharynx is clear and moist.  Eyes: Conjunctivae and EOM are normal. Pupils are equal, round, and reactive to light.  Neck: Normal range of motion. Neck supple. Carotid bruit is not present. No thyromegaly present.  Cardiovascular: Normal rate, regular rhythm, normal heart sounds and intact distal pulses.  Exam reveals no gallop and no friction rub.   No murmur heard. Pulmonary/Chest: Effort normal and breath sounds normal. He has no wheezes. He has no rales.  Abdominal: Soft. Bowel sounds are normal. He exhibits no distension and no mass. There is no tenderness. There is no rebound and no guarding.  Musculoskeletal:       Lumbar back: He exhibits pain. He exhibits normal range of motion, no tenderness, no bony tenderness, no spasm and normal pulse.  Lumbar spine:  Non-tender midline; non-tender paraspinal regions B.  Straight leg raises negative B; toe and heel walking intact; marching intact; motor 5/5 BLE.   Full ROM lumbar spine without limitation. Cervical spine: non-tender midline; non-tender paraspinal regions B; full ROM cervical spine without limitation.  Motor 5/5 BUE.  Grip 5/5.   Lymphadenopathy:    He has no cervical adenopathy.  Neurological: He is alert and oriented to person, place, and time. No cranial nerve deficit.  Skin: Skin is warm and dry. No rash noted. He is not diaphoretic.  Psychiatric: He has a normal mood and affect. His behavior is normal.  Nursing note and vitals reviewed.  Results for orders placed or performed in visit on 04/10/15  GC/Chlamydia Probe Amp  Result Value Ref Range   CT Probe RNA NEGATIVE    GC Probe RNA NEGATIVE   CBC with Differential/Platelet  Result Value  Ref Range   WBC 8.9 4.0 - 10.5 K/uL   RBC 5.78 4.22 - 5.81 MIL/uL   Hemoglobin 16.7 13.0 - 17.0 g/dL   HCT 47.5 39.0 - 52.0 %   MCV 82.2 78.0 - 100.0 fL   MCH 28.9 26.0 - 34.0 pg   MCHC 35.2 30.0 - 36.0 g/dL   RDW 13.2 11.5 - 15.5 %   Platelets 200 150 - 400 K/uL   MPV 10.4 8.6 - 12.4 fL   Neutrophils Relative % 68 43 - 77 %   Neutro Abs 6.1 1.7 - 7.7 K/uL   Lymphocytes Relative 22 12 - 46 %   Lymphs Abs 2.0 0.7 - 4.0 K/uL   Monocytes Relative 8 3 - 12 %   Monocytes Absolute 0.7 0.1 - 1.0 K/uL   Eosinophils Relative 2 0 - 5 %   Eosinophils Absolute 0.2 0.0 - 0.7 K/uL   Basophils Relative 0 0 - 1 %   Basophils Absolute 0.0 0.0 - 0.1 K/uL   Smear Review Criteria for review not met   Comprehensive metabolic panel  Result Value Ref Range   Sodium 140 135 - 146 mEq/L   Potassium 4.4 3.5 - 5.3 mEq/L   Chloride 100 98 - 110 mEq/L   CO2 30 20 - 31 mEq/L   Glucose, Bld 93 65 - 99 mg/dL   BUN 15 7 - 25 mg/dL   Creat 0.85 0.60 - 1.35 mg/dL   Total Bilirubin 2.0 (H) 0.2 - 1.2 mg/dL   Alkaline Phosphatase 78 40 - 115 U/L   AST 20 10 - 40 U/L   ALT 36 9 - 46 U/L   Total Protein 7.2 6.1 - 8.1 g/dL   Albumin 4.7 3.6 - 5.1 g/dL   Calcium 9.5 8.6 - 10.3 mg/dL  Hemoglobin A1c  Result  Value Ref Range   Hgb A1c MFr Bld 5.3 <5.7 %   Mean Plasma Glucose 105 <117 mg/dL  Lipid panel  Result Value Ref Range   Cholesterol 140 125 - 200 mg/dL   Triglycerides 196 (H) <150 mg/dL   HDL 30 (L) >=40 mg/dL   Total CHOL/HDL Ratio 4.7 <=5.0 Ratio   VLDL 39 (H) <30 mg/dL   LDL Cholesterol 71 <130 mg/dL  TSH  Result Value Ref Range   TSH 0.937 0.350 - 4.500 uIU/mL  Vitamin B12  Result Value Ref Range   Vitamin B-12 750 211 - 911 pg/mL  Vit D  25 hydroxy (rtn osteoporosis monitoring)  Result Value Ref Range   Vit D, 25-Hydroxy 38 30 - 100 ng/mL  HIV antibody  Result Value Ref Range   HIV 1&2 Ab, 4th Generation NONREACTIVE NONREACTIVE  RPR  Result Value Ref Range   RPR Ser Ql NON REAC NON REAC       Assessment & Plan:   1. Attention deficit disorder (ADD) without hyperactivity   2. Lumbar sprain, initial encounter    -controlled ADHD; refills x 3 provided; RTC six months.  Call in 3 months for refills. -new onset lumbar sprain; home exercise program provided to perform daily.   No orders of the defined types were placed in this encounter.  Meds ordered this encounter  Medications  . amphetamine-dextroamphetamine (ADDERALL XR) 30 MG 24 hr capsule    Sig: Take 1 capsule (30 mg total) by mouth every morning. Fill now    Dispense:  30 capsule    Refill:  0  . amphetamine-dextroamphetamine (ADDERALL XR) 30 MG 24 hr capsule  Sig: Take 1 capsule (30 mg total) by mouth daily. Fill in 30 days    Dispense:  30 capsule    Refill:  0  . amphetamine-dextroamphetamine (ADDERALL XR) 30 MG 24 hr capsule    Sig: Take 1 capsule (30 mg total) by mouth every morning. Fill in 60 days    Dispense:  30 capsule    Refill:  0    Return in about 6 months (around 01/22/2017) for recheck ADHD.   Mumtaz Lovins Elayne Guerin, M.D. [facility] [address] [place], [place]  [address] [phone] phone [phone] fax

## 2016-09-23 ENCOUNTER — Other Ambulatory Visit: Payer: Self-pay

## 2016-09-23 NOTE — Telephone Encounter (Signed)
07/25/16 last ov

## 2016-09-23 NOTE — Telephone Encounter (Signed)
PATIENT WOULD LIKE DR. Tamala Julian TO KNOW THAT HE HAS CHANGED HIS PHARMACY TO [facility]. IT IS TIME TO GET HIS ADDERALL XR 30 MG (GENERIC) AND NOW WE NEED TO DO A PRIOR AUTHORIZATION. HE SAID HE TOOK HIS LAST PILL YESTERDAY 09/22/16. PLEASE CALL HIM WHEN IT HAS BEEN DONE. [facility] PHONE [phone] (CELL)  [place] [phone]  [place] PHONE [phone]   [place]

## 2016-09-23 NOTE — Telephone Encounter (Signed)
PATIENT CALLED BACK AGAIN. HE STATES HE CALLED [facility] AND THEY TOLD HIM THERE IS A 14 DAY EVALUATION PERIOD ONCE THE PRIOR AUTHORIZATION IS RECEIVED UNLESS THE DOCTOR MARKS IT AS "URGENT." [facility] PHONE [phone] (CELL)  Matthew Hines

## 2016-09-24 NOTE — Telephone Encounter (Signed)
Have we received a prior authorization request from [facility]?  I provided pt with 3 rx on 07/25/16 visit; has he mailed [facility] the rx for January?

## 2016-09-28 NOTE — Telephone Encounter (Signed)
Pt turned in his rx to me dated 07/25/16 and states his rx are through [facility] mail [facility] and needs to be a 90 day supply from now on for mail order coverage.  [facility] pa started [id number] ID

## 2016-09-28 NOTE — Telephone Encounter (Signed)
PT TOOK RX BACK AND WILL GET A SHORT SUPPLY FOR NOW

## 2016-09-29 NOTE — Telephone Encounter (Signed)
PA denied brand adderall ,  Appeal [phone] Denial explanation in your mail box.  Next step?

## 2016-10-08 NOTE — Telephone Encounter (Signed)
Please call patient --- insurance denied brand Adderall.  I actually do not recall pt wanting/needing/requesting BRAND Adderall. Please call him for confirmation. Is he fine with generic Adderall?

## 2016-10-09 MED ORDER — AMPHETAMINE-DEXTROAMPHET ER 30 MG PO CP24
30.0000 mg | ORAL_CAPSULE | ORAL | 0 refills | Status: DC
Start: 2016-10-09 — End: 2016-10-21

## 2016-10-09 MED ORDER — AMPHETAMINE-DEXTROAMPHET ER 30 MG PO CP24: 30.0000 mg | ORAL_CAPSULE | ORAL | 0 refills | Status: DC

## 2016-10-09 NOTE — Telephone Encounter (Signed)
Additional three months of generic adderall xr 30mg  printed.

## 2016-10-09 NOTE — Telephone Encounter (Signed)
Spoke with patient. He is fine with generic Adderall. He also dropped off paperwork documenting his diagnosis before the age of 89 years.

## 2016-10-12 NOTE — Telephone Encounter (Signed)
Please call pt --- 3 month supply of adderall rx ready for pick up.

## 2016-10-13 NOTE — Telephone Encounter (Signed)
Patient informed. 

## 2016-10-15 ENCOUNTER — Telehealth: Payer: Self-pay

## 2016-10-15 NOTE — Telephone Encounter (Signed)
[name] - Pt came by to pick up his adderall and asked about the papers he had brought by.  In looking back over the notes it looks like they just said he brought by paperwork to scan in about his medical history.  What he says they were supposed to do was request for you to write a letter stating his symptoms were present prior to the age of 62.  He also says that there are survey questions to be filled out. I have left the paperwork in your box describing the denial and what is needed.  There is also a history of his appointments from his pediatric office.  [phone]

## 2016-10-16 NOTE — Telephone Encounter (Signed)
fyi

## 2016-10-21 ENCOUNTER — Other Ambulatory Visit: Payer: Self-pay | Admitting: Family Medicine

## 2016-10-21 ENCOUNTER — Telehealth: Payer: Self-pay | Admitting: Physician Assistant

## 2016-10-21 MED ORDER — AMPHETAMINE-DEXTROAMPHET ER 30 MG PO CP24: 30.0000 mg | ORAL_CAPSULE | ORAL | 0 refills | Status: DC

## 2016-10-21 NOTE — Telephone Encounter (Signed)
Spoke with [facility] and was advised that the PA denied was for the [facility] Adderall. The patient meets their criteria for that, but the generic was intended. The PA for the generic product is valid 09/14/2016-03/14/2017. Patient notified.

## 2016-10-21 NOTE — Telephone Encounter (Signed)
Pt says this needs to be marked urgent or it could take up to two more weeks for him to get it.

## 2016-10-21 NOTE — Progress Notes (Signed)
Refill provided

## 2016-11-18 ENCOUNTER — Telehealth: Payer: Self-pay | Admitting: Family Medicine

## 2016-11-18 NOTE — Telephone Encounter (Signed)
Pt had drug screen for work and it came out positive because pt is taking generic for adderral. Drug screening company is requesting proof that pt is actually prescribed this medication. They would like confirmation to be emailed to them at [email or web address]

## 2016-11-19 ENCOUNTER — Telehealth: Payer: Self-pay | Admitting: Family Medicine

## 2016-11-19 NOTE — Telephone Encounter (Signed)
Please write if appropriate.

## 2016-11-19 NOTE — Telephone Encounter (Signed)
Pt is calling back to let us know that he will need this letter to confirm he is taking adderall due to drug screen results by noon tomorrow and that it can be faxed to [phone]  Best number [phone]

## 2016-11-20 NOTE — Telephone Encounter (Signed)
faxed

## 2016-11-20 NOTE — Telephone Encounter (Signed)
I have created letter; please email company and obtain fax number to fax letter.  Or, email letter to them at noted email address.

## 2016-11-20 NOTE — Telephone Encounter (Signed)
I have created letter; please fax to the number listed.

## 2017-01-27 ENCOUNTER — Ambulatory Visit: Payer: BLUE CROSS/BLUE SHIELD | Admitting: Family Medicine

## 2017-01-29 ENCOUNTER — Ambulatory Visit (INDEPENDENT_AMBULATORY_CARE_PROVIDER_SITE_OTHER): Payer: BLUE CROSS/BLUE SHIELD | Admitting: Family Medicine

## 2017-01-29 ENCOUNTER — Encounter: Payer: Self-pay | Admitting: Family Medicine

## 2017-01-29 VITALS — BP 127/86 | HR 110 | Temp 98.5°F | Resp 16 | Ht 68.0 in | Wt 255.8 lb

## 2017-01-29 DIAGNOSIS — Z113 Encounter for screening for infections with a predominantly sexual mode of transmission: Secondary | ICD-10-CM

## 2017-01-29 DIAGNOSIS — F988 Other specified behavioral and emotional disorders with onset usually occurring in childhood and adolescence: Secondary | ICD-10-CM | POA: Diagnosis not present

## 2017-01-29 DIAGNOSIS — R3989 Other symptoms and signs involving the genitourinary system: Secondary | ICD-10-CM | POA: Diagnosis not present

## 2017-01-29 DIAGNOSIS — D239 Other benign neoplasm of skin, unspecified: Secondary | ICD-10-CM | POA: Diagnosis not present

## 2017-01-29 DIAGNOSIS — S93492A Sprain of other ligament of left ankle, initial encounter: Secondary | ICD-10-CM

## 2017-01-29 DIAGNOSIS — E669 Obesity, unspecified: Secondary | ICD-10-CM

## 2017-01-29 LAB — POCT URINALYSIS DIP (MANUAL ENTRY)
BILIRUBIN UA: NEGATIVE
BILIRUBIN UA: NEGATIVE mg/dL
Glucose, UA: NEGATIVE mg/dL
Leukocytes, UA: NEGATIVE
Nitrite, UA: NEGATIVE
PH UA: 5 (ref 5.0–8.0)
Protein Ur, POC: NEGATIVE mg/dL
Spec Grav, UA: 1.02 (ref 1.010–1.025)
Urobilinogen, UA: 0.2 E.U./dL

## 2017-01-29 LAB — POC MICROSCOPIC URINALYSIS (UMFC): MUCUS RE: ABSENT

## 2017-01-29 MED ORDER — AMPHETAMINE-DEXTROAMPHET ER 30 MG PO CP24: 30.0000 mg | ORAL_CAPSULE | ORAL | 0 refills | Status: DC

## 2017-01-29 NOTE — Patient Instructions (Addendum)
   IF you received an x-ray today, you will receive an invoice from [facility]. Please contact  Radiology at [phone] with questions or concerns regarding your invoice.   IF you received labwork today, you will receive an invoice from [facility]. Please contact [facility] at [phone] with questions or concerns regarding your invoice.   Our billing staff will not be able to assist you with questions regarding bills from these companies.  You will be contacted with the lab results as soon as they are available. The fastest way to get your results is to activate your My Chart account. Instructions are located on the last page of this paperwork. If you have not heard from us regarding the results in 2 weeks, please contact this office.     Calorie Counting for Weight Loss Calories are units of energy. Your body needs a certain amount of calories from food to keep you going throughout the day. When you eat more calories than your body needs, your body stores the extra calories as fat. When you eat fewer calories than your body needs, your body burns fat to get the energy it needs. Calorie counting means keeping track of how many calories you eat and drink each day. Calorie counting can be helpful if you need to lose weight. If you make sure to eat fewer calories than your body needs, you should lose weight. Ask your health care provider what a healthy weight is for you. For calorie counting to work, you will need to eat the right number of calories in a day in order to lose a healthy amount of weight per week. A dietitian can help you determine how many calories you need in a day and will give you suggestions on how to reach your calorie goal.  A healthy amount of weight to lose per week is usually 1-2 lb (0.5-0.9 kg). This usually means that your daily calorie intake should be reduced by 500-750 calories.  Eating 1,200 - 1,500 calories per day can help most women lose  weight.  Eating 1,500 - 1,800 calories per day can help most men lose weight. What is my plan? My goal is to have __________ calories per day. If I have this many calories per day, I should lose around __________ pounds per week. What do I need to know about calorie counting? In order to meet your daily calorie goal, you will need to:  Find out how many calories are in each food you would like to eat. Try to do this before you eat.  Decide how much of the food you plan to eat.  Write down what you ate and how many calories it had. Doing this is called keeping a food log. To successfully lose weight, it is important to balance calorie counting with a healthy lifestyle that includes regular activity. Aim for 150 minutes of moderate exercise (such as walking) or 75 minutes of vigorous exercise (such as running) each week. Where do I find calorie information?   The number of calories in a food can be found on a Nutrition Facts label. If a food does not have a Nutrition Facts label, try to look up the calories online or ask your dietitian for help. Remember that calories are listed per serving. If you choose to have more than one serving of a food, you will have to multiply the calories per serving by the amount of servings you plan to eat. For example, the label on a package of   bread might say that a serving size is 1 slice and that there are 90 calories in a serving. If you eat 1 slice, you will have eaten 90 calories. If you eat 2 slices, you will have eaten 180 calories. How do I keep a food log? Immediately after each meal, record the following information in your food log:  What you ate. Don't forget to include toppings, sauces, and other extras on the food.  How much you ate. This can be measured in cups, ounces, or number of items.  How many calories each food and drink had.  The total number of calories in the meal. Keep your food log near you, such as in a small notebook in your  pocket, or use a mobile app or website. Some programs will calculate calories for you and show you how many calories you have left for the day to meet your goal. What are some calorie counting tips?  Use your calories on foods and drinks that will fill you up and not leave you hungry:  Some examples of foods that fill you up are nuts and nut butters, vegetables, lean proteins, and high-fiber foods like whole grains. High-fiber foods are foods with more than 5 g fiber per serving.  Drinks such as sodas, specialty coffee drinks, alcohol, and juices have a lot of calories, yet do not fill you up.  Eat nutritious foods and avoid empty calories. Empty calories are calories you get from foods or beverages that do not have many vitamins or protein, such as candy, sweets, and soda. It is better to have a nutritious high-calorie food (such as an avocado) than a food with few nutrients (such as a bag of chips).  Know how many calories are in the foods you eat most often. This will help you calculate calorie counts faster.  Pay attention to calories in drinks. Low-calorie drinks include water and unsweetened drinks.  Pay attention to nutrition labels for "low fat" or "fat free" foods. These foods sometimes have the same amount of calories or more calories than the full fat versions. They also often have added sugar, starch, or salt, to make up for flavor that was removed with the fat.  Find a way of tracking calories that works for you. Get creative. Try different apps or programs if writing down calories does not work for you. What are some portion control tips?  Know how many calories are in a serving. This will help you know how many servings of a certain food you can have.  Use a measuring cup to measure serving sizes. You could also try weighing out portions on a kitchen scale. With time, you will be able to estimate serving sizes for some foods.  Take some time to put servings of different foods  on your favorite plates, bowls, and cups so you know what a serving looks like.  Try not to eat straight from a bag or box. Doing this can lead to overeating. Put the amount you would like to eat in a cup or on a plate to make sure you are eating the right portion.  Use smaller plates, glasses, and bowls to prevent overeating.  Try not to multitask (for example, watch TV or use your computer) while eating. If it is time to eat, sit down at a table and enjoy your food. This will help you to know when you are full. It will also help you to be aware of what you are eating and   how much you are eating. What are tips for following this plan? Reading food labels   Check the calorie count compared to the serving size. The serving size may be smaller than what you are used to eating.  Check the source of the calories. Make sure the food you are eating is high in vitamins and protein and low in saturated and trans fats. Shopping   Read nutrition labels while you shop. This will help you make healthy decisions before you decide to purchase your food.  Make a grocery list and stick to it. Cooking   Try to cook your favorite foods in a healthier way. For example, try baking instead of frying.  Use low-fat dairy products. Meal planning   Use more fruits and vegetables. Half of your plate should be fruits and vegetables.  Include lean proteins like poultry and fish. How do I count calories when eating out?  Ask for smaller portion sizes.  Consider sharing an entree and sides instead of getting your own entree.  If you get your own entree, eat only half. Ask for a box at the beginning of your meal and put the rest of your entree in it so you are not tempted to eat it.  If calories are listed on the menu, choose the lower calorie options.  Choose dishes that include vegetables, fruits, whole grains, low-fat dairy products, and lean protein.  Choose items that are boiled, broiled, grilled, or  steamed. Stay away from items that are buttered, battered, fried, or served with cream sauce. Items labeled "crispy" are usually fried, unless stated otherwise.  Choose water, low-fat milk, unsweetened iced tea, or other drinks without added sugar. If you want an alcoholic beverage, choose a lower calorie option such as a glass of wine or light beer.  Ask for dressings, sauces, and syrups on the side. These are usually high in calories, so you should limit the amount you eat.  If you want a salad, choose a garden salad and ask for grilled meats. Avoid extra toppings like bacon, cheese, or fried items. Ask for the dressing on the side, or ask for olive oil and vinegar or lemon to use as dressing.  Estimate how many servings of a food you are given. For example, a serving of cooked rice is  cup or about the size of half a baseball. Knowing serving sizes will help you be aware of how much food you are eating at restaurants. The list below tells you how big or small some common portion sizes are based on everyday objects:  1 oz-4 stacked dice.  3 oz-1 deck of cards.  1 tsp-1 die.  1 Tbsp- a ping-pong ball.  2 Tbsp-1 ping-pong ball.   cup- baseball.  1 cup-1 baseball. Summary  Calorie counting means keeping track of how many calories you eat and drink each day. If you eat fewer calories than your body needs, you should lose weight.  A healthy amount of weight to lose per week is usually 1-2 lb (0.5-0.9 kg). This usually means reducing your daily calorie intake by 500-750 calories.  The number of calories in a food can be found on a Nutrition Facts label. If a food does not have a Nutrition Facts label, try to look up the calories online or ask your dietitian for help.  Use your calories on foods and drinks that will fill you up, and not on foods and drinks that will leave you hungry.  Use smaller plates, glasses,   and bowls to prevent overeating. This information is not intended to  replace advice given to you by your health care provider. Make sure you discuss any questions you have with your health care provider. Document Released: 08/31/2005 Document Revised: 07/31/2016 Document Reviewed: 07/31/2016 Elsevier Interactive Patient Education  2017 [employer]  

## 2017-01-29 NOTE — Progress Notes (Signed)
Subjective:    Patient ID: Matthew Hines, male    DOB: November 18, 1985, 32 y.o.   MRN: [id number]  01/29/2017  Medication Refill (Adderall xr); other (Pt has a mass on right side of chest - has a history of "fatty tumors" ); and Hematuria (States he thinks he had blood in his urine 2 weeks ago - he had some flank pain then but that has subsided )   HPI This 31 y.o. male presents for evaluation of ADHD.  Patient reports good compliance with medication, good tolerance to medication, and good symptom control.  Work performance is good.  No issues.    Obesity: went to a weight loss physician without much benefit.  Prescribed Vitamin B12 and Phentermine; did suffer with dizziness.  No plans of doing that again.  Girlfriend has lost 30 pounds.  In [facility].  Lives in [place]. Phentermine curved appetite for the first week. Vitamin B12 helped but faded quickly.  Felt more motivated.   L medial ankle: occurs in rugby; when reaching down to pick up something and weightbearing on one foot.  Improving.  Not super concerned.  Hematuria: occurred two weeks ago.  Girlfriend thought it was blood.  Not sure as cause; had some pain in L side.  No kidney stones.  No dysuria.  No penile discharge. No scrotal pain.  Girlfriend with HPV.   Girlfriend has genital warts.  No n/v.  1-2 weeks duration.  Urine was dark orange.  Dark orange urine.  Has bene taking vitamin B12 orally; will take it some.  No pink or red urine; dark orange.     There is no immunization history on file for this patient. BP Readings from Last 3 Encounters:  01/29/17 127/86  07/25/16 122/80  04/17/16 (!) 122/92   Wt Readings from Last 3 Encounters:  01/29/17 255 lb 12.8 oz (116 kg)  07/25/16 244 lb (110.7 kg)  04/17/16 242 lb 3.2 oz (109.9 kg)     Review of Systems  Constitutional: Negative for activity change, appetite change, chills, diaphoresis, fatigue and fever.  Respiratory: Negative for cough and shortness of breath.     Cardiovascular: Negative for chest pain, palpitations and leg swelling.  Gastrointestinal: Negative for abdominal pain, diarrhea, nausea and vomiting.  Endocrine: Negative for cold intolerance, heat intolerance, polydipsia, polyphagia and polyuria.  Genitourinary: Positive for hematuria. Negative for difficulty urinating, discharge, dysuria, enuresis, flank pain, frequency, genital sores, penile pain, penile swelling, scrotal swelling, testicular pain and urgency.  Musculoskeletal: Positive for arthralgias and gait problem.  Skin: Negative for color change, rash and wound.  Neurological: Negative for dizziness, tremors, seizures, syncope, facial asymmetry, speech difficulty, weakness, light-headedness, numbness and headaches.  Psychiatric/Behavioral: Positive for decreased concentration. Negative for dysphoric mood and sleep disturbance. The patient is not nervous/anxious.     Past Medical History:  Diagnosis Date  . ADD (attention deficit disorder)   . OCD (obsessive compulsive disorder)    Past Surgical History:  Procedure Laterality Date  . EYE SURGERY    . HERNIA REPAIR    . TYMPANOSTOMY TUBE PLACEMENT     Allergies  Allergen Reactions  . Amoxicillin Rash  . Penicillins Rash    Social History   Social History  . Marital status: Single    Spouse name: N/A  . Number of children: N/A  . Years of education: N/A   Occupational History  . Not on file.   Social History Main Topics  . Smoking status: Current Every Day Smoker  Packs/day: 0.25    Years: 11.00    Types: Cigarettes  . Smokeless tobacco: Never Used  . Alcohol use 0.6 - 1.2 oz/week    1 - 2 Standard drinks or equivalent per week  . Drug use: No  . Sexual activity: Yes   Other Topics Concern  . 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  Last STD screening in college.    Dates females only.  Condoms most of time.    Family History  Problem Relation Age of Onset  . Heart disease Maternal Grandmother   . Stroke Maternal Grandmother   . Heart disease Maternal Grandfather   . Obesity Mother        Objective:    BP 127/86   Pulse (!) 110   Temp 98.5 F (36.9 C) (Oral)   Resp 16   Ht 5\' 8"  (1.727 m)   Wt 255 lb 12.8 oz (116 kg)   SpO2 98%   BMI 38.89 kg/m  Physical Exam  Constitutional: He is oriented to person, place, and time. He appears well-developed and well-nourished. No distress.  HENT:  Head: Normocephalic and atraumatic.  Right Ear: External ear normal.  Left Ear: External ear normal.  Nose: Nose normal.  Mouth/Throat: Oropharynx is clear and moist.  Eyes: Conjunctivae and EOM are normal. Pupils are equal, round, and reactive to light.  Neck: Normal range of motion. Neck supple. Carotid bruit is not present. No thyromegaly present.  Cardiovascular: Normal rate, regular rhythm, normal heart sounds and intact distal pulses.  Exam reveals no gallop and no friction rub.   No murmur heard. Pulmonary/Chest: Effort normal and breath sounds normal. He has no wheezes. He has no rales.  Abdominal: Soft. Bowel sounds are normal. He exhibits no distension and no mass. There is no tenderness. There is no rebound and no guarding.  Musculoskeletal:       Left knee: Normal. He exhibits normal range of motion. No tenderness found.       Left ankle: He exhibits normal range of motion, no swelling, no ecchymosis, no deformity, no laceration and normal pulse. Tenderness. Medial malleolus tenderness found. No lateral malleolus, no AITFL, no CF  ligament, no posterior TFL, no head of 5th metatarsal and no proximal fibula tenderness found. Achilles tendon normal.       Left lower leg: Normal. He exhibits no tenderness, no bony tenderness and no swelling.  Lymphadenopathy:    He has no cervical adenopathy.  Neurological: He is alert and oriented to person, place, and time. No cranial nerve deficit.  Skin: Skin is warm and dry. No rash noted. He is not diaphoretic.  +nevus present.  Psychiatric: He has a normal mood and affect. His behavior is normal.  Nursing note and vitals reviewed.       Assessment & Plan:  1. Attention deficit disorder (ADD) without hyperactivity   2. Urine discoloration   3. Blue nevus   4. Sprain of other ligament of left ankle, initial encounter   5. Obesity (BMI 35.0-39.9 without comorbidity)   6. Screening examination for STD (sexually transmitted disease)    -ADHD controlled; refills provided; RTC six months; call in 3 months for refills. -reported hematuria; obtain urine; also STD screening and urine culture. -new onset LEFT ankle sprain.  Recommend rest, icing, stretching. -for obesity, recommend exercise daily for 45 minutes with 1800 kcal restriction.  Orders Placed This Encounter  Procedures  . GC/Chlamydia Probe Amp  . Urine culture  . HIV antibody  . RPR  . Comprehensive metabolic panel  . CBC with Differential/Platelet  . POCT urinalysis dipstick  . POCT Microscopic Urinalysis ([facility])   Meds ordered this encounter  Medications  . amphetamine-dextroamphetamine (ADDERALL XR) 30 MG 24 hr capsule    Sig: Take 1 capsule (30 mg total) by mouth every morning.    Dispense:  90 capsule    Refill:  0    Return in about 6 months (around 08/01/2017) for complete physical examiniation.   Shannette Tabares Elayne Guerin, M.D. Primary Care at [facility] previously [facility] [address] [place], [place]  [address] [phone] phone [phone] fax

## 2017-01-30 LAB — COMPREHENSIVE METABOLIC PANEL WITH GFR
ALT: 67 IU/L — ABNORMAL HIGH (ref 0–44)
AST: 32 IU/L (ref 0–40)
Albumin/Globulin Ratio: 2 (ref 1.2–2.2)
Albumin: 4.7 g/dL (ref 3.5–5.5)
Alkaline Phosphatase: 62 IU/L (ref 39–117)
BUN/Creatinine Ratio: 14 (ref 9–20)
BUN: 13 mg/dL (ref 6–20)
Bilirubin Total: 1.4 mg/dL — ABNORMAL HIGH (ref 0.0–1.2)
CO2: 23 mmol/L (ref 18–29)
Calcium: 10.3 mg/dL — ABNORMAL HIGH (ref 8.7–10.2)
Chloride: 99 mmol/L (ref 96–106)
Creatinine, Ser: 0.91 mg/dL (ref 0.76–1.27)
GFR calc Af Amer: 130 mL/min/1.73
GFR calc non Af Amer: 113 mL/min/1.73
Globulin, Total: 2.3 g/dL (ref 1.5–4.5)
Glucose: 86 mg/dL (ref 65–99)
Potassium: 4.7 mmol/L (ref 3.5–5.2)
Sodium: 140 mmol/L (ref 134–144)
Total Protein: 7 g/dL (ref 6.0–8.5)

## 2017-01-30 LAB — CBC WITH DIFFERENTIAL/PLATELET
BASOS: 0 %
Basophils Absolute: 0 10*3/uL (ref 0.0–0.2)
EOS (ABSOLUTE): 0.1 10*3/uL (ref 0.0–0.4)
Eos: 2 %
Hematocrit: 46.8 % (ref 37.5–51.0)
Hemoglobin: 16.3 g/dL (ref 13.0–17.7)
IMMATURE GRANS (ABS): 0 10*3/uL (ref 0.0–0.1)
Immature Granulocytes: 0 %
LYMPHS ABS: 2.5 10*3/uL (ref 0.7–3.1)
Lymphs: 37 %
MCH: 30.4 pg (ref 26.6–33.0)
MCHC: 34.8 g/dL (ref 31.5–35.7)
MCV: 87 fL (ref 79–97)
MONOS ABS: 0.6 10*3/uL (ref 0.1–0.9)
Monocytes: 9 %
NEUTROS ABS: 3.5 10*3/uL (ref 1.4–7.0)
Neutrophils: 52 %
PLATELETS: 205 10*3/uL (ref 150–379)
RBC: 5.37 x10E6/uL (ref 4.14–5.80)
RDW: 13.3 % (ref 12.3–15.4)
WBC: 6.7 10*3/uL (ref 3.4–10.8)

## 2017-01-30 LAB — URINE CULTURE: Organism ID, Bacteria: NO GROWTH

## 2017-01-30 LAB — SYPHILIS: RPR W/REFLEX TO RPR TITER AND TREPONEMAL ANTIBODIES, TRADITIONAL SCREENING AND DIAGNOSIS ALGORITHM: RPR Ser Ql: NONREACTIVE

## 2017-01-30 LAB — HIV ANTIBODY (ROUTINE TESTING W REFLEX): HIV SCREEN 4TH GENERATION: NONREACTIVE

## 2017-01-31 LAB — GC/CHLAMYDIA PROBE AMP
CHLAMYDIA, DNA PROBE: NEGATIVE
NEISSERIA GONORRHOEAE BY PCR: NEGATIVE

## 2017-03-05 ENCOUNTER — Other Ambulatory Visit: Payer: Self-pay

## 2017-03-05 ENCOUNTER — Encounter: Payer: Self-pay | Admitting: *Deleted

## 2017-03-05 ENCOUNTER — Emergency Department (INDEPENDENT_AMBULATORY_CARE_PROVIDER_SITE_OTHER): Payer: BLUE CROSS/BLUE SHIELD

## 2017-03-05 ENCOUNTER — Emergency Department
Admission: EM | Admit: 2017-03-05 | Discharge: 2017-03-05 | Disposition: A | Discharge status: Home / Self Care | Payer: BLUE CROSS/BLUE SHIELD | Source: Home / Self Care | Attending: Family Medicine | Admitting: Family Medicine

## 2017-03-05 DIAGNOSIS — G8929 Other chronic pain: Secondary | ICD-10-CM

## 2017-03-05 DIAGNOSIS — R202 Paresthesia of skin: Secondary | ICD-10-CM

## 2017-03-05 DIAGNOSIS — M542 Cervicalgia: Secondary | ICD-10-CM

## 2017-03-05 MED ORDER — PREDNISONE 20 MG PO TABS: ORAL_TABLET | ORAL | 0 refills | Status: DC

## 2017-03-05 NOTE — ED Provider Notes (Signed)
[name]    CSN: [id number] Arrival date & time: 03/05/17  1127     History   Chief Complaint Chief Complaint  Patient presents with  . Tingling  . Numbness    HPI Matthew Hines is a 31 y.o. male.   Patient complains of intermittent weakness in his arms with sensation of tingling in his left shoulder, left upper chest, tingling in his left 3rd, 4th, and 5th fingers.  He has felt fatigued.  He recalls no injury, and no distant neck injuries.  He has participated in contact sports in the distant past.   The history is provided by the patient.    Past Medical History:  Diagnosis Date  . ADD (attention deficit disorder)   . OCD (obsessive compulsive disorder)     Patient Active Problem List   Diagnosis Date Noted  . Blue nevus 04/17/2016  . Obesity (BMI 35.0-39.9 without comorbidity) 12/17/2014  . Attention deficit disorder 12/17/2014    Past Surgical History:  Procedure Laterality Date  . EYE SURGERY    . HERNIA REPAIR    . TYMPANOSTOMY TUBE PLACEMENT         Home Medications    Prior to Admission medications   Medication Sig Start Date End Date Taking? Authorizing Provider  amphetamine-dextroamphetamine (ADDERALL XR) 30 MG 24 hr capsule Take 1 capsule (30 mg total) by mouth every morning. 01/29/17   Wardell Honour, MD  predniSONE (DELTASONE) 20 MG tablet Take one tab by mouth twice daily for 5 days, then one daily for 3 days. Take with food. 03/05/17   Kandra Nicolas, MD    Family History Family History  Problem Relation Age of Onset  . Heart disease Maternal Grandmother   . Stroke Maternal Grandmother   . Heart attack Maternal Grandmother   . Heart disease Maternal Grandfather   . Heart attack Maternal Grandfather   . Obesity Mother     Social History Social History  Substance Use Topics  . Smoking status: Current Every Day Smoker    Packs/day: 0.25    Years: 11.00    Types: Cigarettes  . Smokeless tobacco: Current User   Types: Snuff  . Alcohol use 0.6 - 1.2 oz/week    1 - 2 Standard drinks or equivalent per week     Allergies   Amoxicillin and Penicillins   Review of Systems Review of Systems  Constitutional: Positive for fatigue. Negative for activity change, appetite change, chills, diaphoresis, fever and unexpected weight change.  HENT: Negative.   Eyes: Negative.   Respiratory: Negative.   Cardiovascular: Negative.   Gastrointestinal: Negative.   Genitourinary: Negative.  Negative for enuresis.  Musculoskeletal: Negative.   Skin: Negative.   Neurological: Positive for numbness.  All other systems reviewed and are negative.    Physical Exam Triage Vital Signs ED Triage Vitals  Enc Vitals Group     BP 03/05/17 1149 134/87     Pulse Rate 03/05/17 1149 84     Resp 03/05/17 1149 14     Temp --      Temp src --      SpO2 03/05/17 1149 99 %     Weight 03/05/17 1150 251 lb (113.9 kg)     Height --      Head Circumference --      Peak Flow --      Pain Score 03/05/17 1150 0     Pain Loc --      Pain Edu? --  Excl. in [place]? --    No data found.   Updated Vital Signs BP 134/87 (BP Location: Left Arm)   Pulse 84   Resp 14   Wt 251 lb (113.9 kg)   SpO2 99%   BMI 38.16 kg/m   Visual Acuity Right Eye Distance:   Left Eye Distance:   Bilateral Distance:    Right Eye Near:   Left Eye Near:    Bilateral Near:     Physical Exam  Constitutional: He appears well-developed.  HENT:  Head: Normocephalic and atraumatic.  Right Ear: External ear normal.  Left Ear: External ear normal.  Nose: Nose normal.  Mouth/Throat: Oropharynx is clear and moist.  Eyes: Conjunctivae and EOM are normal. Pupils are equal, round, and reactive to light.  Neck: Normal range of motion. Neck supple. No thyromegaly present.  Cardiovascular: Normal heart sounds.   Pulmonary/Chest: Breath sounds normal.  Abdominal: Bowel sounds are normal. He exhibits no distension.  Musculoskeletal: He exhibits  no edema or tenderness.       Arms: Patient has paresthesias in left arm and shoulder as noted on diagram.  Distal sensation and strength Is intact.  Lymphadenopathy:    He has no cervical adenopathy.  Neurological: He is alert.  Skin: Skin is warm and dry. No rash noted.  Nursing note and vitals reviewed.    UC Treatments / Results  Labs (all labs ordered are listed, but only abnormal results are displayed) Labs Reviewed - No data to display  EKG  EKG Interpretation  Rate:  88 BPM PR:  158 msec QT:  344 msec QTcH:  416 msec QRSD:  100 msec QRS axis:  10 degrees Interpretation:  Normal sinus rhythm; Minimal voltage criteria for LVH; no acute changes.       Radiology Dg Cervical Spine Complete  Result Date: 03/05/2017 CLINICAL DATA:  Chronic neck pain with left upper extremity paresthesia. EXAM: CERVICAL SPINE - COMPLETE 4+ VIEW COMPARISON:  None. FINDINGS: There is no evidence of cervical spine fracture or prevertebral soft tissue swelling. Alignment is normal. No other significant bone abnormalities are identified. No significant neuroforaminal stenosis noted. IMPRESSION: Negative cervical spine radiographs. Electronically Signed   By: Marijo Conception, M.D.   On: 03/05/2017 12:59    Procedures Procedures (including critical care time)  Medications Ordered in UC Medications - No data to display   Initial Impression / Assessment and Plan / UC Course  I have reviewed the triage vital signs and the nursing notes.  Pertinent labs & imaging results that were available during my care of the patient were reviewed by me and considered in my medical decision making (see chart for details).    Suspect cervical radicular pain. Begin prednisone burst/taper. Apply ice pack to left neck for 20 to 30 minutes, 2 to 3 times daily  Continue for 3 to 5 days. Followup with Dr. Aundria Mems or Dr. Lynne Leader ([facility]) if not improving about two weeks.     Final  Clinical Impressions(s) / UC Diagnoses   Final diagnoses:  Paresthesia  Paresthesia of left arm    New Prescriptions New Prescriptions   PREDNISONE (DELTASONE) 20 MG TABLET    Take one tab by mouth twice daily for 5 days, then one daily for 3 days. Take with food.     Kandra Nicolas, MD 03/07/17 1430

## 2017-03-05 NOTE — Discharge Instructions (Signed)
Apply ice pack to left neck for 20 to 30 minutes, 2 to 3 times daily  Continue for 3 to 5 days.

## 2017-03-05 NOTE — ED Triage Notes (Signed)
Patient c/o 3 days of intermittent bilateral arm weakness, left hand and chest tingling that goes into his shoulder. He became fatigued 3 days ago with  Minimal activity.

## 2017-03-07 ENCOUNTER — Telehealth: Payer: Self-pay | Admitting: Emergency Medicine

## 2017-03-07 NOTE — Telephone Encounter (Signed)
Left message, advised if doing well, disregard call, otherwise may contact the office if needed.  Matthew Hines

## 2017-03-14 ENCOUNTER — Encounter: Payer: Self-pay | Admitting: Family Medicine

## 2017-03-15 NOTE — Progress Notes (Signed)
Letter sent.

## 2017-04-29 ENCOUNTER — Telehealth: Payer: Self-pay | Admitting: Family Medicine

## 2017-04-29 NOTE — Telephone Encounter (Signed)
Pt is needing a refill on his adderall   Best number is [phone]

## 2017-04-30 NOTE — Telephone Encounter (Signed)
Please advise Last prescribed 01/29/17

## 2017-05-03 MED ORDER — AMPHETAMINE-DEXTROAMPHET ER 30 MG PO CP24: 30.0000 mg | ORAL_CAPSULE | ORAL | 0 refills | Status: DC

## 2017-05-03 NOTE — Telephone Encounter (Signed)
Call --- Adderall will be ready for pick up on Tuesday, 05/04/17.

## 2017-05-03 NOTE — Telephone Encounter (Signed)
Adv pt phone message was put in on 8/16 and the time frame for a med refill is 48-72 business hours.

## 2017-05-04 ENCOUNTER — Telehealth: Payer: Self-pay | Admitting: Family Medicine

## 2017-05-04 NOTE — Telephone Encounter (Signed)
Pt aware script will be ready for pick up this afternoon

## 2017-05-04 NOTE — Telephone Encounter (Signed)
Pt says his insurance is requiring prior authorization to get the adderal filled. [phone]

## 2017-05-05 NOTE — Telephone Encounter (Signed)
I got PA from [employer]  Approval good 05/05/2017-05/05/2018. Patient aware.

## 2017-05-25 ENCOUNTER — Emergency Department
Admission: EM | Admit: 2017-05-25 | Discharge: 2017-05-25 | Disposition: A | Discharge status: Home / Self Care | Payer: BLUE CROSS/BLUE SHIELD | Source: Home / Self Care | Attending: Family Medicine | Admitting: Family Medicine

## 2017-05-25 ENCOUNTER — Encounter: Payer: Self-pay | Admitting: *Deleted

## 2017-05-25 DIAGNOSIS — B9789 Other viral agents as the cause of diseases classified elsewhere: Secondary | ICD-10-CM | POA: Diagnosis not present

## 2017-05-25 DIAGNOSIS — J069 Acute upper respiratory infection, unspecified: Secondary | ICD-10-CM

## 2017-05-25 MED ORDER — DOXYCYCLINE HYCLATE 100 MG PO CAPS: 100.0000 mg | ORAL_CAPSULE | Freq: Two times a day (BID) | ORAL | 0 refills | Status: DC

## 2017-05-25 NOTE — ED Provider Notes (Signed)
[name]    CSN: [id number] Arrival date & time: 05/25/17  1509     History   Chief Complaint Chief Complaint  Patient presents with  . Nasal Congestion    HPI Matthew Hines is a 31 y.o. male.   Patient complains of "itchy" throat, nasal congestion, and hoarseness for two days.  Today he developed sweats, increased fatigue, and occasional cough.   The history is provided by the patient.    Past Medical History:  Diagnosis Date  . ADD (attention deficit disorder)   . OCD (obsessive compulsive disorder)     Patient Active Problem List   Diagnosis Date Noted  . Blue nevus 04/17/2016  . Obesity (BMI 35.0-39.9 without comorbidity) 12/17/2014  . Attention deficit disorder 12/17/2014    Past Surgical History:  Procedure Laterality Date  . EYE SURGERY    . HERNIA REPAIR    . TYMPANOSTOMY TUBE PLACEMENT         Home Medications    Prior to Admission medications   Medication Sig Start Date End Date Taking? Authorizing Provider  amphetamine-dextroamphetamine (ADDERALL XR) 30 MG 24 hr capsule Take 1 capsule (30 mg total) by mouth every morning. 05/03/17   Wardell Honour, MD  doxycycline (VIBRAMYCIN) 100 MG capsule Take 1 capsule (100 mg total) by mouth 2 (two) times daily. Take with food (Rx void after 06/02/17) 05/25/17   Kandra Nicolas, MD    Family History Family History  Problem Relation Age of Onset  . Heart disease Maternal Grandmother   . Stroke Maternal Grandmother   . Heart attack Maternal Grandmother   . Heart disease Maternal Grandfather   . Heart attack Maternal Grandfather   . Obesity Mother     Social History Social History  Substance Use Topics  . Smoking status: Current Every Day Smoker    Packs/day: 0.25    Years: 11.00    Types: Cigarettes  . Smokeless tobacco: Current User    Types: Snuff  . Alcohol use 2.4 oz/week    4 Standard drinks or equivalent per week     Allergies   Amoxicillin and Penicillins   Review  of Systems Review of Systems No sore throat, but throat feels "itchy" + hoarse + cough No pleuritic pain No wheezing + nasal congestion + post-nasal drainage No sinus pain/pressure No itchy/red eyes No earache No hemoptysis No SOB No fever, + chills/sweats No nausea No vomiting No abdominal pain No diarrhea No urinary symptoms No skin rash + fatigue No myalgias No headache Used OTC meds without relief   Physical Exam Triage Vital Signs ED Triage Vitals [05/25/17 1538]  Enc Vitals Group     BP (!) 159/104     Pulse Rate 85     Resp 18     Temp 98.1 F (36.7 C)     Temp Source Oral     SpO2 99 %     Weight 266 lb (120.7 kg)     Height 5\' 9"  (1.753 m)     Head Circumference      Peak Flow      Pain Score 0     Pain Loc      Pain Edu?      Excl. in [place]?    No data found.   Updated Vital Signs BP (!) 159/104 (BP Location: Left Arm)   Pulse 85   Temp 98.1 F (36.7 C) (Oral)   Resp 18   Ht 5\' 9"  (1.753  m)   Wt 266 lb (120.7 kg)   SpO2 99%   BMI 39.28 kg/m   Visual Acuity Right Eye Distance:   Left Eye Distance:   Bilateral Distance:    Right Eye Near:   Left Eye Near:    Bilateral Near:     Physical Exam Nursing notes and Vital Signs reviewed. Appearance:  Patient appears stated age, and in no acute distress Eyes:  Pupils are equal, round, and reactive to light and accomodation.  Extraocular movement is intact.  Conjunctivae are not inflamed  Ears:  Canals normal.  Tympanic membranes normal.  Nose:  Mildly congested turbinates.  No sinus tenderness.    Pharynx:  Normal Neck:  Supple.  Enlarged posterior/lateral nodes are palpated bilaterally, tender to palpation on the left.   Lungs:  Clear to auscultation.  Breath sounds are equal.  Moving air well. Heart:  Regular rate and rhythm without murmurs, rubs, or gallops.  Abdomen:  Nontender without masses or hepatosplenomegaly.  Bowel sounds are present.  No CVA or flank tenderness.  Extremities:   No edema.  Skin:  No rash present.    UC Treatments / Results  Labs (all labs ordered are listed, but only abnormal results are displayed) Labs Reviewed - No data to display  EKG  EKG Interpretation None       Radiology No results found.  Procedures Procedures (including critical care time)  Medications Ordered in UC Medications - No data to display   Initial Impression / Assessment and Plan / UC Course  I have reviewed the triage vital signs and the nursing notes.  Pertinent labs & imaging results that were available during my care of the patient were reviewed by me and considered in my medical decision making (see chart for details).    There is no evidence of bacterial infection today.   Treat symptomatically for now  Take plain guaifenesin (1200mg  extended release tabs such as Mucinex) twice daily, with plenty of water, for cough and congestion.  May continue Pseudoephedrine (30mg , one or two every 4 to 6 hours) for sinus congestion.  Get adequate rest.   May use Afrin nasal spray (or generic oxymetazoline) each morning for about 5 days and then discontinue.  Also recommend using saline nasal spray several times daily and saline nasal irrigation (AYR is a common brand).  Use Flonase nasal spray each morning after using Afrin nasal spray and saline nasal irrigation. Try warm salt water gargles for sore throat.  Stop all antihistamines for now, and other non-prescription cough/cold preparations. May take Ibuprofen 200mg , 4 tabs every 8 hours with food for sore throat, body aches, etc. May take Delsym Cough Suppressant at bedtime for nighttime cough.  Begin Doxycycline if not improving about one week or if persistent fever develops (Given a prescription to hold, with an expiration date)  Follow-up with family doctor if not improving about10 days.     Final Clinical Impressions(s) / UC Diagnoses   Final diagnoses:  Viral URI with cough    New Prescriptions New  Prescriptions   DOXYCYCLINE (VIBRAMYCIN) 100 MG CAPSULE    Take 1 capsule (100 mg total) by mouth 2 (two) times daily. Take with food (Rx void after 06/02/17)        Kandra Nicolas, MD 05/28/17 1256

## 2017-05-25 NOTE — ED Triage Notes (Signed)
Pt c/o runny nose, itchy throat and ears, minimal coughing, and pressure behind his eyes x 2 days. Denies fever.

## 2017-05-25 NOTE — Discharge Instructions (Signed)
Take plain guaifenesin (1200mg  extended release tabs such as Mucinex) twice daily, with plenty of water, for cough and congestion.  May continue Pseudoephedrine (30mg , one or two every 4 to 6 hours) for sinus congestion.  Get adequate rest.   May use Afrin nasal spray (or generic oxymetazoline) each morning for about 5 days and then discontinue.  Also recommend using saline nasal spray several times daily and saline nasal irrigation (AYR is a common brand).  Use Flonase nasal spray each morning after using Afrin nasal spray and saline nasal irrigation. Try warm salt water gargles for sore throat.  Stop all antihistamines for now, and other non-prescription cough/cold preparations. May take Ibuprofen 200mg , 4 tabs every 8 hours with food for sore throat, body aches, etc. May take Delsym Cough Suppressant at bedtime for nighttime cough.  Begin Doxycycline if not improving about one week or if persistent fever develops   Follow-up with family doctor if not improving about10 days.

## 2017-07-28 ENCOUNTER — Ambulatory Visit: Payer: BLUE CROSS/BLUE SHIELD | Admitting: Family Medicine

## 2017-07-28 ENCOUNTER — Other Ambulatory Visit: Payer: Self-pay

## 2017-07-28 ENCOUNTER — Encounter: Payer: Self-pay | Admitting: Family Medicine

## 2017-07-28 VITALS — BP 118/82 | HR 115 | Temp 98.0°F | Resp 16 | Ht 69.69 in | Wt 264.0 lb

## 2017-07-28 DIAGNOSIS — E669 Obesity, unspecified: Secondary | ICD-10-CM | POA: Diagnosis not present

## 2017-07-28 DIAGNOSIS — M25531 Pain in right wrist: Secondary | ICD-10-CM | POA: Diagnosis not present

## 2017-07-28 DIAGNOSIS — R945 Abnormal results of liver function studies: Secondary | ICD-10-CM

## 2017-07-28 DIAGNOSIS — M25532 Pain in left wrist: Secondary | ICD-10-CM | POA: Diagnosis not present

## 2017-07-28 DIAGNOSIS — G5623 Lesion of ulnar nerve, bilateral upper limbs: Secondary | ICD-10-CM

## 2017-07-28 DIAGNOSIS — F988 Other specified behavioral and emotional disorders with onset usually occurring in childhood and adolescence: Secondary | ICD-10-CM | POA: Diagnosis not present

## 2017-07-28 DIAGNOSIS — R7989 Other specified abnormal findings of blood chemistry: Secondary | ICD-10-CM

## 2017-07-28 MED ORDER — AMPHETAMINE-DEXTROAMPHET ER 30 MG PO CP24: 30.0000 mg | ORAL_CAPSULE | ORAL | 0 refills | Status: DC

## 2017-07-28 NOTE — Progress Notes (Signed)
Subjective:    Patient ID: Matthew Hines, male    DOB: Jun 25, 1986, 31 y.o.   MRN: [id number]  07/28/2017  ADD (6 month follow-up)    HPI This 31 y.o. male presents for evaluation of ADHD, elevated LFTs, hypercalcemia, obesity.  Patient reports good compliance with medication, good tolerance to medication, and good symptom control.  Adderall XR 30mg  daily is working well.  Needs refill; work performance is good; no side effects to medication.  Sleeping well.  Elevated LFTs: due for repeat labs from last visit in 01/2017.  Hypercalcemia: due for repeat labs from 01/2017 visit.  R>L wrist pain: nervous tick where rotates wrist and flexes wrist. Gets off bed and gets a pinched nerve.   No swelling.  Did pop knuckles in the past; now popping sideways.  20-30 times per day.  No n/t/burning. Some shotting pain radiating up arm and down hand.  4th and 5th digits.  Different locations at work every time.  No set desk.  Visits five locations.  S/p cervical spine films in 02/2017 negative.   Obesity: playing sports twice per week for 1.5 hours.  Not exercising regularly.  Not actively trying to lose weight yet plans to work on weight loss.     BP Readings from Last 3 Encounters:  07/28/17 118/82  05/25/17 (!) 159/104  03/05/17 134/87   Wt Readings from Last 3 Encounters:  07/28/17 264 lb (119.7 kg)  05/25/17 266 lb (120.7 kg)  03/05/17 251 lb (113.9 kg)    There is no immunization history on file for this patient.  Review of Systems  Constitutional: Negative for activity change, appetite change, chills, diaphoresis, fatigue and fever.  Respiratory: Negative for cough and shortness of breath.   Cardiovascular: Negative for chest pain, palpitations and leg swelling.  Gastrointestinal: Negative for abdominal pain, diarrhea, nausea and vomiting.  Endocrine: Negative for cold intolerance, heat intolerance, polydipsia, polyphagia and polyuria.  Musculoskeletal: Positive for arthralgias.    Skin: Negative for color change, rash and wound.  Neurological: Negative for dizziness, tremors, seizures, syncope, facial asymmetry, speech difficulty, weakness, light-headedness, numbness and headaches.  Psychiatric/Behavioral: Positive for decreased concentration. Negative for dysphoric mood and sleep disturbance. The patient is not nervous/anxious.     Past Medical History:  Diagnosis Date  . ADD (attention deficit disorder)   . OCD (obsessive compulsive disorder)    Past Surgical History:  Procedure Laterality Date  . EYE SURGERY    . HERNIA REPAIR    . TYMPANOSTOMY TUBE PLACEMENT     Allergies  Allergen Reactions  . Amoxicillin Rash  . Penicillins Rash   No current outpatient medications on file prior to visit.   No current facility-administered medications on file prior to visit.    Social History   Socioeconomic History  . Marital status: Single    Spouse name: Not on file  . Number of children: Not on file  . Years of education: Not on file  . Highest education level: Not on file  Social Needs  . Financial resource strain: Not on file  . Food insecurity - worry: Not on file  . Food insecurity - inability: Not on file  . Transportation needs - medical: Not on file  . Transportation needs - non-medical: Not on file  Occupational History  . Not on file  Tobacco Use  . Smoking status: Current Every Day Smoker    Packs/day: 0.25    Years: 11.00    Pack years: 2.75  Types: Cigarettes  . Smokeless tobacco: Current User    Types: Snuff  Substance and Sexual Activity  . Alcohol use: Yes    Alcohol/week: 2.4 oz    Types: 4 Standard drinks or equivalent per week  . Drug use: No  . Sexual activity: Yes  Other Topics Concern  . 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  Last STD screening in college.    Dates females only.  Condoms most of time.    Family History  Problem Relation Age of Onset  . Heart disease Maternal Grandmother   . Stroke Maternal Grandmother   . Heart attack Maternal Grandmother   . Heart disease Maternal Grandfather   . Heart attack Maternal Grandfather   . Obesity Mother        Objective:    BP 118/82   Pulse (!) 115   Temp 98 F (36.7 C) (Oral)   Resp 16   Ht 5' 9.69" (1.77 m)   Wt 264 lb (119.7 kg)   SpO2 99%   BMI 38.22 kg/m  Physical Exam  Constitutional: He is oriented to person, place, and time. He appears well-developed and well-nourished. No distress.  HENT:  Head: Normocephalic and atraumatic.  Right Ear: External ear normal.  Left Ear: External ear normal.  Nose: Nose normal.  Mouth/Throat: Oropharynx is clear and moist.  Eyes: Conjunctivae and EOM are normal. Pupils are equal, round, and reactive to light.  Neck: Normal range of motion. Neck supple. Carotid bruit is not present. No thyromegaly present.  Cardiovascular: Normal rate, regular rhythm, normal heart sounds and intact distal pulses. Exam reveals no gallop and no friction rub.  No murmur heard. Pulmonary/Chest: Effort normal and breath sounds normal. He has no wheezes. He has no rales.  Abdominal: Soft. Bowel sounds are normal. He exhibits no distension and no mass. There is no tenderness. There is no rebound and no guarding.  Musculoskeletal:       Right shoulder: He exhibits decreased range of motion.       Left shoulder: He exhibits decreased range of motion.       Right elbow: Normal.      Left elbow: Normal.       Right wrist: Normal. He exhibits normal range of motion, no  tenderness, no bony tenderness, no swelling, no effusion, no crepitus, no deformity and no laceration.       Left wrist: Normal. He exhibits normal range of motion, no tenderness, no bony tenderness, no swelling, no effusion, no crepitus, no deformity and no laceration.       Cervical back: Normal.       Right hand: Normal.       Left hand: Normal.  Lymphadenopathy:    He has no cervical adenopathy.  Neurological: He is alert and oriented to person, place, and  time. No cranial nerve deficit.  Skin: Skin is warm and dry. No rash noted. He is not diaphoretic.  Psychiatric: He has a normal mood and affect. His behavior is normal.  Nursing note and vitals reviewed.  No results found. Depression screen [facility] 2/9 07/28/2017 01/29/2017 07/25/2016 10/16/2015 10/16/2015  Decreased Interest 0 0 0 0 0  Down, Depressed, Hopeless 0 0 0 0 0  PHQ - 2 Score 0 0 0 0 0   Fall Risk  07/28/2017 01/29/2017 07/25/2016 07/18/2015 04/10/2015  Falls in the past year? No No No No No        Assessment & Plan:   1. Attention deficit disorder (ADD) without hyperactivity   2. Elevated LFTs   3. Hypercalcemia   4. Pain in both wrists   5. Obesity (BMI 35.0-39.9 without comorbidity)   6. Ulnar neuropathy of both upper extremities    ADHD controlled.  90-day supply provided.  Please call in 3 months for refill.  Return to clinic in 6 months. New onset elevated LFTs at last visit in May 2018.  Repeat labs today with acute hepatitis panel.  If persistent, obtain abdominal ultrasound.  Likely secondary to obesity. New onset hypercalcemia at visit in May 2019.  Repeat calcium level today with PTH intact. New-onset pain in both wrists.  Has nervous habit flexing wrist continuously throughout the day.  Having intermittent paresthesias in forearms.  Also having some ulnar neuropathy distribution.  Recommend wrist splints throughout the day for the next 2-3 weeks to break the habit of repetitive movement of wrist.  If worsens,  consider nerve conduction studies. Persistent obesity.  Discussed at length during visit.  Recommend weight loss, exercise, low-fat low-calorie diet.  Initial goal weight of 200 pounds.  Patient desires to reach 180 pounds in the future.  Recommend 60 g of protein per day and an 1800-calorie limitation per day. Patient considering establishing care in [place] where he lives.  Recommended [facility] primary care providers.    Orders Placed This Encounter  Procedures  . Comprehensive metabolic panel  . Acute Hep Panel & Hep B Surface Ab  . Parathyroid hormone, intact (no Ca)   Meds ordered this encounter  Medications  . amphetamine-dextroamphetamine (ADDERALL XR) 30 MG 24 hr capsule    Sig: Take 1 capsule (30 mg total) every morning by mouth.    Dispense:  90 capsule    Refill:  0    Return in about 6 months (around 01/25/2018) for follow-up chronic medical conditions.   Lunette Tapp Elayne Guerin, M.D. Primary Care at [facility] previously [facility] [place] [address] [place], [place]  [address] [phone] phone [phone] fax

## 2017-07-28 NOTE — Patient Instructions (Addendum)
  Primary Care in [place]: Lynne Leader, MD Beatrice Lecher, MD Dr. Anise Salvo?   I recommend wrist splints during the day to break the habit of rotating wrists during the day.   IF you received an x-ray today, you will receive an invoice from [facility]. Please contact [facility] at [phone] with questions or concerns regarding your invoice.   IF you received labwork today, you will receive an invoice from [facility]. Please contact [facility] at [phone] with questions or concerns regarding your invoice.   Our billing staff will not be able to assist you with questions regarding bills from these companies.  You will be contacted with the lab results as soon as they are available. The fastest way to get your results is to activate your My Chart account. Instructions are located on the last page of this paperwork. If you have not heard from [place] regarding the results in 2 weeks, please contact this office.

## 2017-07-28 NOTE — Progress Notes (Deleted)
Subjective:    Patient ID: Matthew Hines, male    DOB: December 30, 1985, 31 y.o.   MRN: [id number]  07/28/2017  ADD (6 month follow-up)    HPI This 31 y.o. male presents for evaluation of  BP Readings from Last 3 Encounters:  07/28/17 118/82  05/25/17 (!) 159/104  03/05/17 134/87   Wt Readings from Last 3 Encounters:  07/28/17 264 lb (119.7 kg)  05/25/17 266 lb (120.7 kg)  03/05/17 251 lb (113.9 kg)    There is no immunization history on file for this patient.  Review of Systems  Past Medical History:  Diagnosis Date  . ADD (attention deficit disorder)   . OCD (obsessive compulsive disorder)    Past Surgical History:  Procedure Laterality Date  . EYE SURGERY    . HERNIA REPAIR    . TYMPANOSTOMY TUBE PLACEMENT     Allergies  Allergen Reactions  . Amoxicillin Rash  . Penicillins Rash   Current Outpatient Medications on File Prior to Visit  Medication Sig Dispense Refill  . amphetamine-dextroamphetamine (ADDERALL XR) 30 MG 24 hr capsule Take 1 capsule (30 mg total) by mouth every morning. 90 capsule 0   No current facility-administered medications on file prior to visit.    Social History   Socioeconomic History  . Marital status: Single    Spouse name: Not on file  . Number of children: Not on file  . Years of education: Not on file  . Highest education level: Not on file  Social Needs  . Financial resource strain: Not on file  . Food insecurity - worry: Not on file  . Food insecurity - inability: Not on file  . Transportation needs - medical: Not on file  . Transportation needs - non-medical: Not on file  Occupational History  . Not on file  Tobacco Use  . Smoking status: Current Every Day Smoker    Packs/day: 0.25    Years: 11.00    Pack years: 2.75    Types: Cigarettes  . Smokeless tobacco: Current User    Types: Snuff  Substance and Sexual Activity  . Alcohol use: Yes    Alcohol/week: 2.4 oz    Types: 4 Standard drinks or equivalent per week    . Drug use: No  . Sexual activity: Yes  Other Topics Concern  . 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  Last STD screening in college.    Dates females only.  Condoms most of time.    Family History  Problem Relation Age of Onset  . Heart disease Maternal Grandmother   . Stroke Maternal Grandmother   . Heart attack Maternal Grandmother   . Heart disease Maternal Grandfather   . Heart attack Maternal Grandfather   . Obesity Mother        Objective:    BP 118/82  Pulse (!) 115   Temp 98 F (36.7 C) (Oral)   Resp 16   Ht 5' 9.69" (1.77 m)   Wt 264 lb (119.7 kg)   SpO2 99%   BMI 38.22 kg/m  Physical Exam No results found. Depression screen [facility] 2/9 07/28/2017 01/29/2017 07/25/2016 10/16/2015 10/16/2015  Decreased Interest 0 0 0 0 0  Down, Depressed, Hopeless 0 0 0 0 0  PHQ - 2 Score 0 0 0 0 0   Fall Risk  07/28/2017 01/29/2017 07/25/2016 07/18/2015 04/10/2015  Falls in the past year? No No No No No        Assessment & Plan:   1. Obesity (BMI 35.0-39.9 without comorbidity)   2. Attention deficit disorder (ADD) without hyperactivity   3. Elevated LFTs   4. Hypercalcemia       No orders of the defined types were placed in this encounter.  No orders of the defined types were placed in this encounter.   No Follow-up on file.   Undra Trembath Elayne Guerin, M.D. Primary Care at [facility] previously [facility] [place] [address] [place], [place]  [address] [phone] phone [phone] fax

## 2017-07-29 LAB — ACUTE HEP PANEL AND HEP B SURFACE AB
HEP A IGM: NEGATIVE
HEP B C IGM: NEGATIVE
Hep C Virus Ab: <0.1 s/co ratio (ref 0.0–0.9)
Hepatitis B Surf Ab Quant: 18.2 m[IU]/mL (ref >9.9)
Hepatitis B Surface Ag: NEGATIVE

## 2017-07-29 LAB — COMPREHENSIVE METABOLIC PANEL
A/G RATIO: 2.3 — AB (ref 1.2–2.2)
ALK PHOS: 64 IU/L (ref 39–117)
ALT: 74 IU/L — AB (ref 0–44)
AST: 32 IU/L (ref 0–40)
Albumin: 4.9 g/dL (ref 3.5–5.5)
BUN / CREAT RATIO: 15 (ref 9–20)
BUN: 13 mg/dL (ref 6–20)
Bilirubin Total: 1.5 mg/dL — ABNORMAL HIGH (ref 0.0–1.2)
CO2: 22 mmol/L (ref 20–29)
Calcium: 9.6 mg/dL (ref 8.7–10.2)
Chloride: 102 mmol/L (ref 96–106)
Creatinine, Ser: 0.87 mg/dL (ref 0.76–1.27)
GFR calc non Af Amer: 115 mL/min/{1.73_m2} (ref >59)
GFR, EST AFRICAN AMERICAN: 133 mL/min/{1.73_m2} (ref >59)
Globulin, Total: 2.1 g/dL (ref 1.5–4.5)
Glucose: 96 mg/dL (ref 65–99)
POTASSIUM: 4.4 mmol/L (ref 3.5–5.2)
Sodium: 140 mmol/L (ref 134–144)
TOTAL PROTEIN: 7 g/dL (ref 6.0–8.5)

## 2017-07-29 LAB — PARATHYROID HORMONE, INTACT (NO CA): PTH: 33 pg/mL (ref 15–65)

## 2017-10-25 ENCOUNTER — Other Ambulatory Visit: Payer: Self-pay | Admitting: Family Medicine

## 2017-10-25 NOTE — Telephone Encounter (Signed)
Adderall ER 30 MG refill Last OV: 07/28/17 Last Refill:07/28/17 Pharmacy: [facility]

## 2017-10-25 NOTE — Telephone Encounter (Signed)
Call patient ---- what pharmacy does he want me to send Adderall?

## 2017-10-25 NOTE — Telephone Encounter (Signed)
Copied from [place]. Topic: Quick Communication - Rx Refill/Question >> Oct 25, 2017  1:14 PM Arletha Grippe wrote: Medication:  amphetamine-dextroamphetamine (ADDERALL XR) 30 MG 24 hr capsule   Has the patient contacted their pharmacy? No.   (Agent: If no, request that the patient contact the pharmacy for the refill.)   Preferred Pharmacy (with phone number or street name): [phone]    Agent: Please be advised that RX refills may take up to 3 business days. We ask that you follow-up with your pharmacy.

## 2017-10-25 NOTE — Telephone Encounter (Signed)
Patient is requesting a refill of the following medications: Requested Prescriptions   Pending Prescriptions Disp Refills  . amphetamine-dextroamphetamine (ADDERALL XR) 30 MG 24 hr capsule 90 capsule 0    Sig: Take 1 capsule (30 mg total) by mouth every morning.    Date of patient request: 10/25/17 Last office visit: 07/28/18 Date of last refill: 07/28/18 Last refill amount: #90 0RF Follow up time period per chart: 01/26/18

## 2017-10-26 NOTE — Telephone Encounter (Signed)
adderall refill Last OV: 07/28/17 Last Refill:07/28/17 [place], [place]

## 2017-10-29 MED ORDER — AMPHETAMINE-DEXTROAMPHET ER 30 MG PO CP24: 30.0000 mg | ORAL_CAPSULE | ORAL | 0 refills | Status: AC

## 2017-10-29 NOTE — Telephone Encounter (Signed)
Copied from [place]. Topic: Quick Communication - Rx Refill/Question >> Oct 29, 2017 11:07 AM Carolyn Stare wrote:  Pt call back today to follow up on the following med amphetamine-dextroamphetamine (ADDERALL XR) 30 MG 24 hr capsule  [phone]

## 2017-10-29 NOTE — Telephone Encounter (Signed)
Refill of Adderall sent to [facility] on [address].  Please advise patient and apologize for delay; original phone message did not include pharmacy name.

## 2017-10-30 NOTE — Telephone Encounter (Signed)
Called pt - LMOVM with Dr. Thompson Caul apology

## 2017-12-16 ENCOUNTER — Telehealth: Payer: Self-pay | Admitting: Family Medicine

## 2017-12-16 NOTE — Telephone Encounter (Signed)
Copied from [place] [phone]. Topic: Quick Communication - See Telephone Encounter >> Dec 16, 2017 10:02 AM Ivar Drape wrote: CRM for notification. See Telephone encounter for: 12/16/17. The patient wanted to report to the doctor that the last prescription for the  amphetamine-dextroamphetamine (ADDERALL XR) 30 MG 24 hr capsule medication, the capsules were all half full to below half full, and they were not tampered with in his possession.

## 2017-12-27 NOTE — Telephone Encounter (Signed)
Phone call to patient. Advised of message from provider, he verbalizes understanding.

## 2017-12-27 NOTE — Telephone Encounter (Signed)
Please call patient:  Advise him to contact his pharmacy with this information as this is strictly a pharmacy issue since they dispense the medication.

## 2018-01-26 ENCOUNTER — Ambulatory Visit: Payer: BLUE CROSS/BLUE SHIELD | Admitting: Family Medicine

## 2018-01-28 ENCOUNTER — Encounter: Payer: Self-pay | Admitting: Family Medicine

## 2018-02-02 ENCOUNTER — Encounter: Payer: Self-pay | Admitting: Family Medicine

## 2018-04-29 ENCOUNTER — Telehealth: Payer: Self-pay | Admitting: Family Medicine

## 2018-04-29 NOTE — Telephone Encounter (Signed)
Attempted to call patient to let him know his medication was approved by his insurance company. He did not answer and could not leave a voicemail because he doesn't have an updated dpr on file.

## 2018-06-18 IMAGING — DX DG CERVICAL SPINE COMPLETE 4+V
7 series · 7 of 7 positions shown · non-contrast
Comparison: None.

CLINICAL DATA: Chronic neck pain with left upper extremity
paresthesia.

EXAM:
CERVICAL SPINE - COMPLETE 4+ VIEW

[c-spine lat]
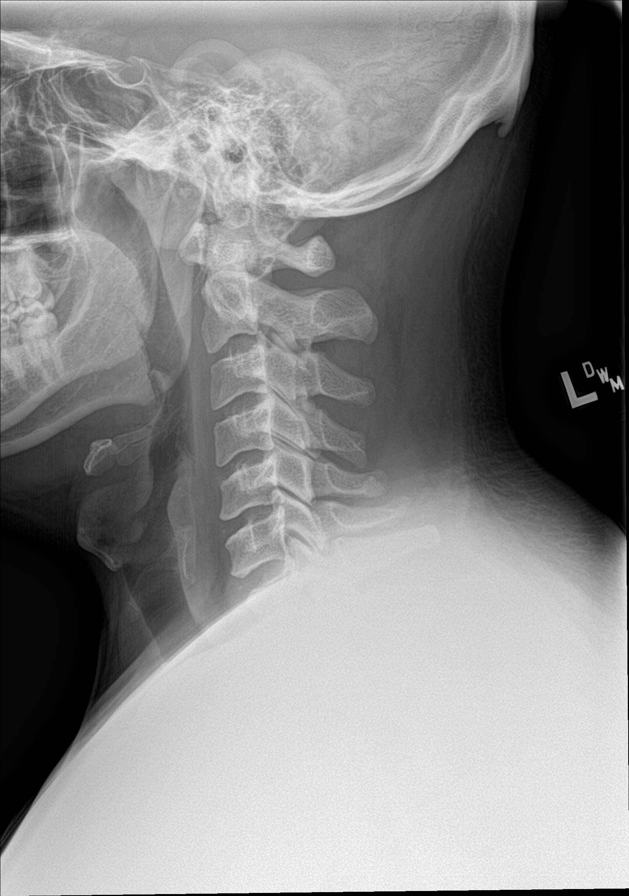

[c-spine obl (1 of 2)]
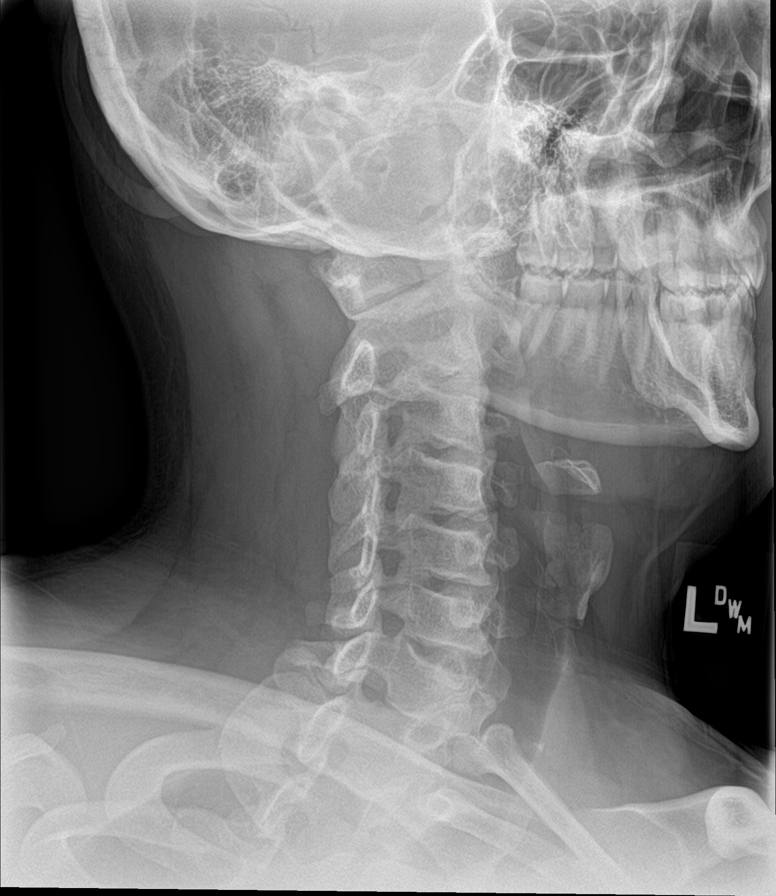

[c-spine obl (2 of 2)]
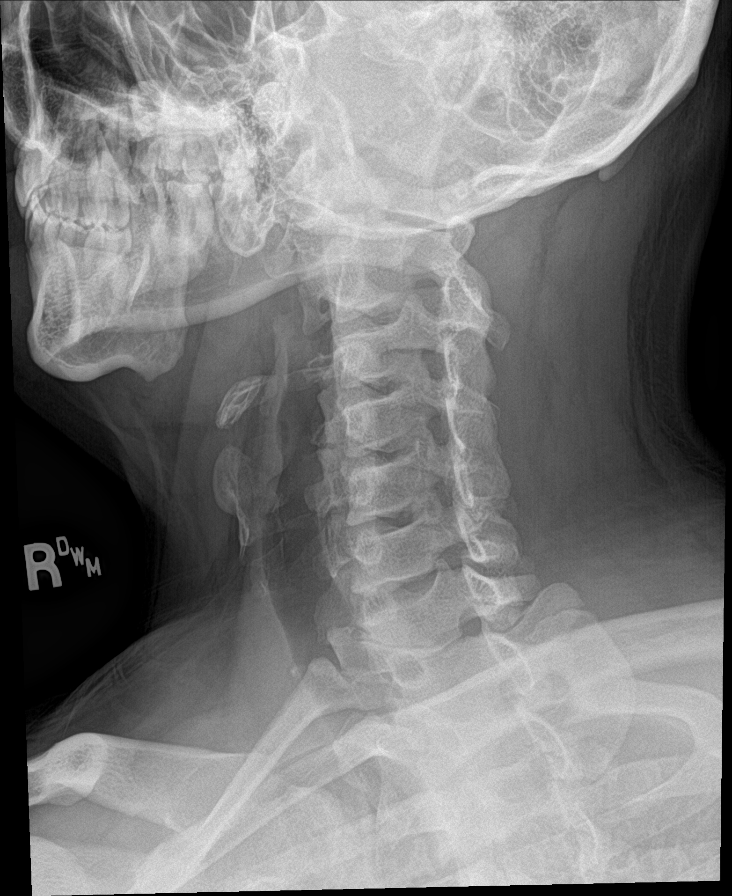

[c-spine ap]
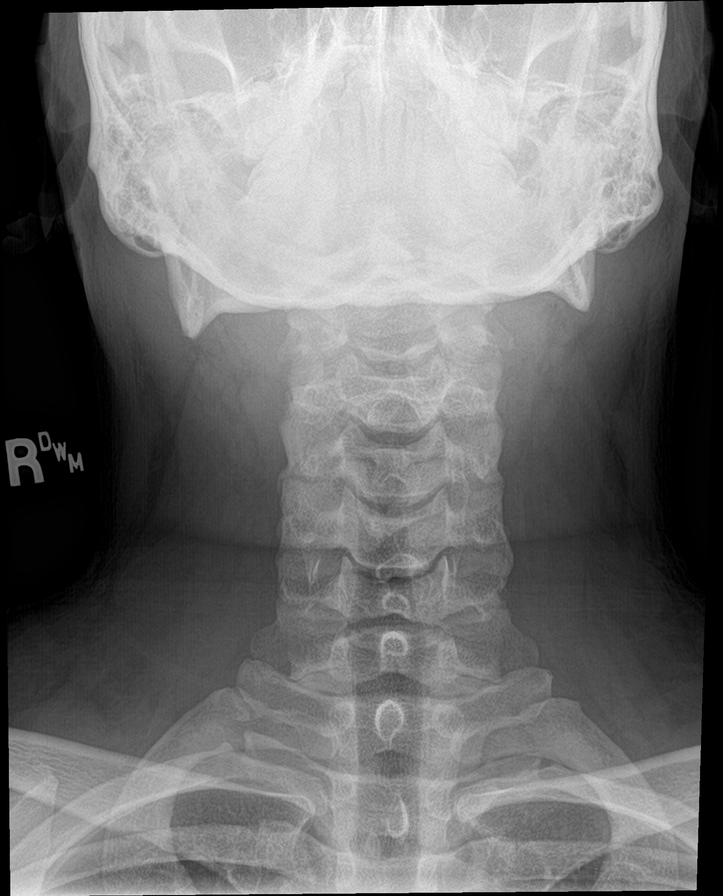

[c-spine open mouth]
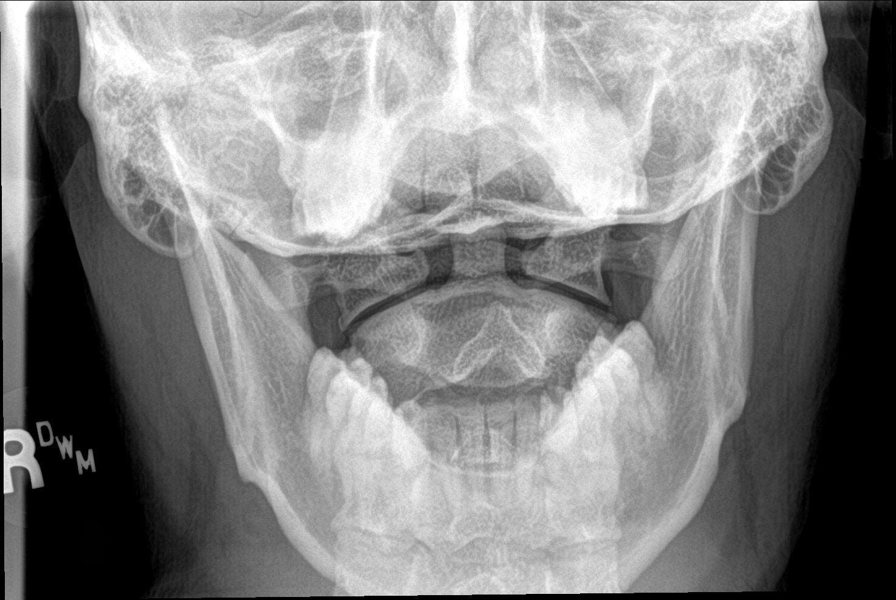

[c-spine swimmers]
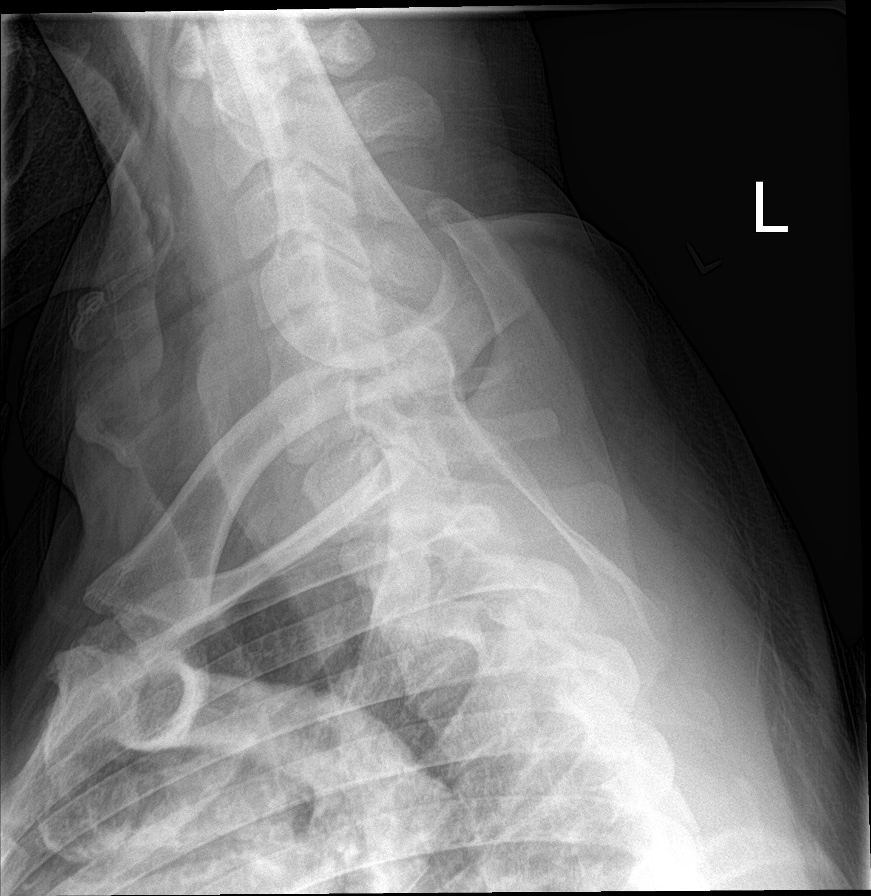

[[person_name]]
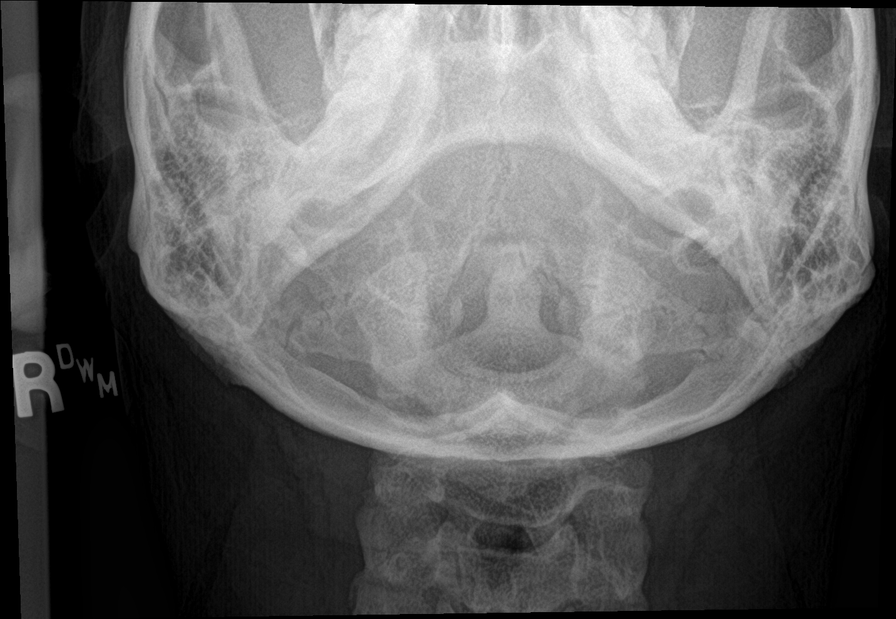

[7 of 7 positions shown; findings below may reference images not displayed]

FINDINGS: There is no evidence of cervical spine fracture or prevertebral soft
tissue swelling. Alignment is normal. No other significant bone
abnormalities are identified. No significant neuroforaminal stenosis
noted.
IMPRESSION: Negative cervical spine radiographs.

## 2019-04-22 ENCOUNTER — Other Ambulatory Visit (HOSPITAL_COMMUNITY)
Admission: RE | Admit: 2019-04-22 | Discharge: 2019-04-22 | Disposition: A | Discharge status: Home / Self Care | Payer: BC Managed Care – PPO | Source: Ambulatory Visit | Attending: Ophthalmology | Admitting: Ophthalmology

## 2019-04-22 DIAGNOSIS — H5203 Hypermetropia, bilateral: Secondary | ICD-10-CM | POA: Diagnosis not present

## 2019-04-22 DIAGNOSIS — Z20828 Contact with and (suspected) exposure to other viral communicable diseases: Secondary | ICD-10-CM | POA: Diagnosis not present

## 2019-04-22 DIAGNOSIS — H5010 Unspecified exotropia: Secondary | ICD-10-CM | POA: Diagnosis not present

## 2019-04-22 DIAGNOSIS — Z01812 Encounter for preprocedural laboratory examination: Secondary | ICD-10-CM | POA: Diagnosis present

## 2019-04-22 DIAGNOSIS — H53022 Refractive amblyopia, left eye: Secondary | ICD-10-CM | POA: Diagnosis not present

## 2019-04-23 ENCOUNTER — Encounter (HOSPITAL_BASED_OUTPATIENT_CLINIC_OR_DEPARTMENT_OTHER): Payer: Self-pay | Admitting: Ophthalmology

## 2019-04-23 LAB — SARS CORONAVIRUS 2 (TAT 6-24 HRS): SARS Coronavirus 2: NEGATIVE

## 2019-04-23 NOTE — H&P (Signed)
Matthew Hines is an 33 y.o. male.   Chief Complaint: Left eye drifts out and caused diplopia . 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   Past Medical History:  Diagnosis Date  . ADD (attention deficit disorder)   . OCD (obsessive compulsive disorder)     Past Surgical History:  Procedure Laterality Date  . EYE SURGERY    . HERNIA REPAIR    . TYMPANOSTOMY TUBE PLACEMENT      Family History  Problem Relation Age of Onset  . Heart disease Maternal Grandmother   . Stroke Maternal Grandmother   . Heart attack Maternal Grandmother   . Heart disease Maternal Grandfather   . Heart attack Maternal Grandfather   . Obesity Mother    Social History:  reports that he has been smoking cigarettes. He has a 2.75 pack-year smoking history. His smokeless tobacco use includes snuff. He reports current alcohol use of about 4.0 standard drinks of alcohol per week. He reports that he does not use drugs.  Allergies:  Allergies  Allergen Reactions  . Amoxicillin Rash  . Penicillins Rash    No medications prior to admission.    Results for orders placed or performed during the hospital encounter of 04/22/19 (from the past 48 hour(s))  SARS CORONAVIRUS 2 Nasal Swab Aptima Multi Swab     Status: None   Collection Time: 04/22/19  3:12 PM   Specimen: Aptima Multi Swab; Nasal Swab  Result Value Ref Range   SARS Coronavirus 2 NEGATIVE NEGATIVE    Comment: (NOTE) SARS-CoV-2 target nucleic acids are NOT DETECTED. The SARS-CoV-2 RNA is generally detectable in upper and lower respiratory specimens during the acute phase of infection. Negative results do not preclude SARS-CoV-2 infection, do not rule out co-infections with other pathogens, and should not be used as the sole basis for treatment or other patient management decisions. Negative results must be combined with clinical observations, patient history, and epidemiological information. The expected result is  Negative. Fact Sheet for Patients: SugarRoll.be Fact Sheet for Healthcare Providers: https://www.woods-mathews.com/ This test is not yet approved or cleared by the [place] FDA and  has been authorized for detection and/or diagnosis of SARS-CoV-2 by FDA under an Emergency Use Authorization (EUA). This EUA will remain  in effect (meaning this test can be used) for the duration of the COVID-19 declaration under Section 56 4(b)(1) of the Act, 21 U.S.C. section 360bbb-3(b)(1), unless the authorization is terminated or revoked sooner. Performed at [facility], [place] [address], [place], [place] [address]    No results found.  Review of Systems  Constitutional: Negative.   HENT: Negative.   Eyes:       Exotropia os : Diplopia  Gastrointestinal: Negative.   Genitourinary: Negative.   Musculoskeletal: Negative.   Skin: Negative.   Neurological: Negative.   Psychiatric/Behavioral: Negative.     There were no vitals taken for this visit. Physical Exam  Constitutional: He appears well-developed and well-nourished.  HENT:  Head: Normocephalic.  Eyes: Pupils are equal, round, and reactive to light.    Neck: Normal range of motion.  Cardiovascular: Normal rate.  Musculoskeletal: Normal range of motion.  Neurological: He is alert.  Skin: Skin is warm.     Assessment Plan Exotropia OS:   Schedule for EOMS under general anesthesia  LMR  Resection: LLR recession c AS @ LMR. Gevena Cotton, MD    04/23/2019, 6:03 PM

## 2019-04-24 ENCOUNTER — Other Ambulatory Visit: Payer: Self-pay

## 2019-04-24 ENCOUNTER — Encounter (HOSPITAL_BASED_OUTPATIENT_CLINIC_OR_DEPARTMENT_OTHER): Payer: Self-pay | Admitting: *Deleted

## 2019-04-24 NOTE — Progress Notes (Signed)
Spoke with patient via telephone for pre op interview. NPO after MN. No medications AM of surgery. Arrival time 0630.

## 2019-04-25 ENCOUNTER — Encounter (HOSPITAL_BASED_OUTPATIENT_CLINIC_OR_DEPARTMENT_OTHER): Payer: Self-pay | Admitting: Anesthesiology

## 2019-04-25 NOTE — Anesthesia Preprocedure Evaluation (Addendum)
Anesthesia Evaluation  Patient identified by MRN, date of birth, ID band Patient awake    Reviewed: Allergy & Precautions, NPO status , Patient's Chart, lab work & pertinent test results  Airway Mallampati: II       Dental no notable dental hx. (+) Teeth Intact   Pulmonary Current Smoker,    Pulmonary exam normal breath sounds clear to auscultation       Cardiovascular negative cardio ROS Normal cardiovascular exam Rhythm:Regular Rate:Normal     Neuro/Psych PSYCHIATRIC DISORDERS    GI/Hepatic negative GI ROS, Neg liver ROS,   Endo/Other  negative endocrine ROS  Renal/GU negative Renal ROS  negative genitourinary   Musculoskeletal negative musculoskeletal ROS (+)   Abdominal (+) + obese,   Peds  Hematology negative hematology ROS (+)   Anesthesia Other Findings   Reproductive/Obstetrics                           Anesthesia Physical Anesthesia Plan  ASA: II  Anesthesia Plan: General   Post-op Pain Management:    Induction: Intravenous  PONV Risk Score and Plan: 1 and Ondansetron and Dexamethasone  Airway Management Planned: LMA  Additional Equipment: None  Intra-op Plan:   Post-operative Plan: Extubation in OR  Informed Consent: I have reviewed the patients History and Physical, chart, labs and discussed the procedure including the risks, benefits and alternatives for the proposed anesthesia with the patient or authorized representative who has indicated his/her understanding and acceptance.     Dental advisory given  Plan Discussed with: CRNA  Anesthesia Plan Comments:        Anesthesia Quick Evaluation

## 2019-04-26 ENCOUNTER — Ambulatory Visit (HOSPITAL_BASED_OUTPATIENT_CLINIC_OR_DEPARTMENT_OTHER): Payer: BC Managed Care – PPO | Admitting: Anesthesiology

## 2019-04-26 ENCOUNTER — Encounter (HOSPITAL_BASED_OUTPATIENT_CLINIC_OR_DEPARTMENT_OTHER)
Admission: RE | Disposition: A | Discharge status: Home / Self Care | Payer: Self-pay | Source: Home / Self Care | Attending: Ophthalmology

## 2019-04-26 ENCOUNTER — Other Ambulatory Visit: Payer: Self-pay

## 2019-04-26 ENCOUNTER — Ambulatory Visit (HOSPITAL_BASED_OUTPATIENT_CLINIC_OR_DEPARTMENT_OTHER)
Admission: RE | Admit: 2019-04-26 | Discharge: 2019-04-26 | Disposition: A | Discharge status: Home / Self Care | Payer: BC Managed Care – PPO | Attending: Ophthalmology | Admitting: Ophthalmology

## 2019-04-26 ENCOUNTER — Encounter (HOSPITAL_BASED_OUTPATIENT_CLINIC_OR_DEPARTMENT_OTHER): Payer: Self-pay | Admitting: *Deleted

## 2019-04-26 DIAGNOSIS — F429 Obsessive-compulsive disorder, unspecified: Secondary | ICD-10-CM | POA: Insufficient documentation

## 2019-04-26 DIAGNOSIS — H50112 Monocular exotropia, left eye: Secondary | ICD-10-CM | POA: Insufficient documentation

## 2019-04-26 DIAGNOSIS — F17210 Nicotine dependence, cigarettes, uncomplicated: Secondary | ICD-10-CM | POA: Diagnosis not present

## 2019-04-26 HISTORY — PX: MUSCLE RECESSION AND RESECTION: SHX5209

## 2019-04-26 HISTORY — PX: ADJUSTABLE SUTURE MANIPULATION: SHX5290

## 2019-04-26 SURGERY — MUSCLE RECESSION/RESECTION
Anesthesia: General | Site: Eye | Laterality: Left

## 2019-04-26 SURGERY — ADJUSTABLE SUTURE MANIPULATION
Anesthesia: General | Site: Eye | Laterality: Left

## 2019-04-26 MED ORDER — FENTANYL CITRATE (PF) 100 MCG/2ML IJ SOLN
INTRAMUSCULAR | Status: AC
  Filled 2019-04-26: qty 2

## 2019-04-26 MED ORDER — ACETAMINOPHEN-CODEINE #2 300-15 MG PO TABS: 1.0000 | ORAL_TABLET | Freq: Three times a day (TID) | ORAL | 0 refills | Status: AC | PRN

## 2019-04-26 MED ORDER — PROPOFOL 10 MG/ML IV BOLUS
INTRAVENOUS | Status: DC | PRN
  Administered 2019-04-26: 50 mg via INTRAVENOUS
  Administered 2019-04-26: 250 mg via INTRAVENOUS

## 2019-04-26 MED ORDER — FENTANYL CITRATE (PF) 100 MCG/2ML IJ SOLN
25.0000 ug | INTRAMUSCULAR | Status: DC | PRN
  Filled 2019-04-26: qty 1

## 2019-04-26 MED ORDER — LIDOCAINE 2% (20 MG/ML) 5 ML SYRINGE
INTRAMUSCULAR | Status: DC | PRN
  Administered 2019-04-26: 100 mg via INTRAVENOUS

## 2019-04-26 MED ORDER — ACETAMINOPHEN 160 MG/5ML PO SOLN
325.0000 mg | ORAL | Status: DC | PRN
  Filled 2019-04-26: qty 20.3

## 2019-04-26 MED ORDER — ARTIFICIAL TEARS OPHTHALMIC OINT
TOPICAL_OINTMENT | OPHTHALMIC | Status: AC
  Filled 2019-04-26: qty 3.5

## 2019-04-26 MED ORDER — TETRACAINE HCL 0.5 % OP SOLN
OPHTHALMIC | Status: DC | PRN
  Administered 2019-04-26: 12 [drp] via OPHTHALMIC

## 2019-04-26 MED ORDER — FENTANYL CITRATE (PF) 100 MCG/2ML IJ SOLN
INTRAMUSCULAR | Status: DC | PRN
  Administered 2019-04-26 (×2): 50 ug via INTRAVENOUS

## 2019-04-26 MED ORDER — 0.9 % SODIUM CHLORIDE (POUR BTL) OPTIME
TOPICAL | Status: DC | PRN
  Administered 2019-04-26: 500 mL

## 2019-04-26 MED ORDER — ONDANSETRON HCL 4 MG/2ML IJ SOLN
4.0000 mg | Freq: Once | INTRAMUSCULAR | Status: DC | PRN
  Filled 2019-04-26: qty 2

## 2019-04-26 MED ORDER — MIDAZOLAM HCL 2 MG/2ML IJ SOLN
INTRAMUSCULAR | Status: AC
  Filled 2019-04-26: qty 2

## 2019-04-26 MED ORDER — LACTATED RINGERS IV SOLN
INTRAVENOUS | Status: DC
  Administered 2019-04-26: 07:00:00 via INTRAVENOUS
  Administered 2019-04-26: 11:00:00 1000 mL via INTRAVENOUS
  Filled 2019-04-26: qty 1000

## 2019-04-26 MED ORDER — OXYCODONE HCL 5 MG PO TABS
5.0000 mg | ORAL_TABLET | Freq: Once | ORAL | Status: DC | PRN
  Filled 2019-04-26: qty 1

## 2019-04-26 MED ORDER — ACETAMINOPHEN 325 MG PO TABS
325.0000 mg | ORAL_TABLET | ORAL | Status: DC | PRN
  Filled 2019-04-26: qty 2

## 2019-04-26 MED ORDER — MEPERIDINE HCL 25 MG/ML IJ SOLN
6.2500 mg | INTRAMUSCULAR | Status: DC | PRN
  Filled 2019-04-26: qty 1

## 2019-04-26 MED ORDER — PHENYLEPHRINE HCL 2.5 % OP SOLN
OPHTHALMIC | Status: DC | PRN
  Administered 2019-04-26: 3 [drp] via OPHTHALMIC

## 2019-04-26 MED ORDER — TOBRAMYCIN-DEXAMETHASONE 0.3-0.1 % OP OINT
TOPICAL_OINTMENT | OPHTHALMIC | Status: DC | PRN
  Administered 2019-04-26: 1 via OPHTHALMIC

## 2019-04-26 MED ORDER — MIDAZOLAM HCL 5 MG/5ML IJ SOLN
INTRAMUSCULAR | Status: DC | PRN
  Administered 2019-04-26: 2 mg via INTRAVENOUS

## 2019-04-26 MED ORDER — ACETAMINOPHEN 10 MG/ML IV SOLN
INTRAVENOUS | Status: AC
  Filled 2019-04-26: qty 100

## 2019-04-26 MED ORDER — TOBRAMYCIN-DEXAMETHASONE 0.3-0.1 % OP OINT
1.0000 "application " | TOPICAL_OINTMENT | Freq: Two times a day (BID) | OPHTHALMIC | Status: DC
  Filled 2019-04-26: qty 3.5

## 2019-04-26 MED ORDER — ACETAMINOPHEN 10 MG/ML IV SOLN
INTRAVENOUS | Status: DC | PRN
  Administered 2019-04-26: 1000 mg via INTRAVENOUS

## 2019-04-26 MED ORDER — KETOROLAC TROMETHAMINE 30 MG/ML IJ SOLN
INTRAMUSCULAR | Status: DC | PRN
  Administered 2019-04-26: 30 mg via INTRAVENOUS

## 2019-04-26 MED ORDER — PROPOFOL 10 MG/ML IV BOLUS
INTRAVENOUS | Status: AC
  Filled 2019-04-26: qty 40

## 2019-04-26 MED ORDER — OXYCODONE HCL 5 MG/5ML PO SOLN
5.0000 mg | Freq: Once | ORAL | Status: DC | PRN
  Filled 2019-04-26: qty 5

## 2019-04-26 MED ORDER — ONDANSETRON HCL 4 MG/2ML IJ SOLN
INTRAMUSCULAR | Status: DC | PRN
  Administered 2019-04-26: 4 mg via INTRAVENOUS

## 2019-04-26 MED ORDER — POVIDONE-IODINE 5 % OP SOLN
OPHTHALMIC | Status: DC | PRN
  Administered 2019-04-26: 1 via OPHTHALMIC

## 2019-04-26 MED ORDER — DEXAMETHASONE SODIUM PHOSPHATE 4 MG/ML IJ SOLN
INTRAMUSCULAR | Status: DC | PRN
  Administered 2019-04-26: 10 mg via INTRAVENOUS

## 2019-04-26 MED ORDER — BSS IO SOLN
INTRAOCULAR | Status: DC | PRN
  Administered 2019-04-26: 15 mL via INTRAOCULAR

## 2019-04-26 MED ORDER — DEXAMETHASONE SODIUM PHOSPHATE 10 MG/ML IJ SOLN
INTRAMUSCULAR | Status: AC
  Filled 2019-04-26: qty 1

## 2019-04-26 MED ORDER — LIDOCAINE 2% (20 MG/ML) 5 ML SYRINGE
INTRAMUSCULAR | Status: AC
  Filled 2019-04-26: qty 5

## 2019-04-26 MED ORDER — TOBRADEX 0.3-0.1 % OP OINT: 1.0000 "application " | TOPICAL_OINTMENT | Freq: Two times a day (BID) | OPHTHALMIC | 0 refills | Status: AC

## 2019-04-26 MED ORDER — ONDANSETRON HCL 4 MG/2ML IJ SOLN
INTRAMUSCULAR | Status: AC
  Filled 2019-04-26: qty 2

## 2019-04-26 MED ORDER — KETOROLAC TROMETHAMINE 30 MG/ML IJ SOLN
INTRAMUSCULAR | Status: AC
  Filled 2019-04-26: qty 1

## 2019-04-26 SURGICAL SUPPLY — 19 items
APPLICATOR DR MATTHEWS STRL (MISCELLANEOUS) ×3 IMPLANT
CONT SPECI 4OZ STER CLIK (MISCELLANEOUS) ×3 IMPLANT
COVER MAYO STAND STRL (DRAPES) ×3 IMPLANT
COVER SURGICAL LIGHT HANDLE (MISCELLANEOUS) ×3 IMPLANT
GLOVE LITE  25/BX (GLOVE) ×3 IMPLANT
GLOVE SURG SIGNA 7.5 PF LTX (GLOVE) ×3 IMPLANT
KIT TURNOVER CYSTO (KITS) ×3 IMPLANT
MANIFOLD NEPTUNE II (INSTRUMENTS) IMPLANT
MARKER SKIN DUAL TIP RULER LAB (MISCELLANEOUS) ×3 IMPLANT
NS IRRIG 500ML POUR BTL (IV SOLUTION) ×3 IMPLANT
PACK ICE SM (MISCELLANEOUS) ×3 IMPLANT
PAD ALCOHOL SWAB (MISCELLANEOUS) ×3 IMPLANT
SUT VICRYL 8 0 TG140 8 (SUTURE) IMPLANT
SYR 3ML 23GX1 SAFETY (SYRINGE) ×3 IMPLANT
TAPE SURG TRANSPORE 1 IN (GAUZE/BANDAGES/DRESSINGS) ×1 IMPLANT
TAPE SURGICAL TRANSPORE 1 IN (GAUZE/BANDAGES/DRESSINGS) ×2
TOWEL OR 17X26 10 PK STRL BLUE (TOWEL DISPOSABLE) ×3 IMPLANT
TUBE CONNECTING 12'X1/4 (SUCTIONS)
TUBE CONNECTING 12X1/4 (SUCTIONS) IMPLANT

## 2019-04-26 SURGICAL SUPPLY — 29 items
APPLICATOR DR MATTHEWS STRL (MISCELLANEOUS) ×6 IMPLANT
BANDAGE EYE OVAL (MISCELLANEOUS) IMPLANT
BNDG EYE OVAL (GAUZE/BANDAGES/DRESSINGS) ×6 IMPLANT
CAUTERY EYE LOW TEMP 1300F FIN (OPHTHALMIC RELATED) ×6 IMPLANT
CLOSURE WOUND 1/2 X4 (GAUZE/BANDAGES/DRESSINGS) ×1
CORDS BIPOLAR (ELECTRODE) IMPLANT
COVER BACK TABLE 60X90IN (DRAPES) ×3 IMPLANT
COVER MAYO STAND STRL (DRAPES) ×3 IMPLANT
COVER WAND RF STERILE (DRAPES) IMPLANT
DRAPE SHEET LG 3/4 BI-LAMINATE (DRAPES) ×3 IMPLANT
DRAPE SURG 17X23 STRL (DRAPES) ×9 IMPLANT
GLOVE SURG SIGNA 7.5 PF LTX (GLOVE) ×3 IMPLANT
GOWN STRL REUS W/ TWL LRG LVL3 (GOWN DISPOSABLE) ×1 IMPLANT
GOWN STRL REUS W/TWL LRG LVL3 (GOWN DISPOSABLE) ×2
KIT TURNOVER CYSTO (KITS) ×3 IMPLANT
MANIFOLD NEPTUNE II (INSTRUMENTS) IMPLANT
NS IRRIG 500ML POUR BTL (IV SOLUTION) ×3 IMPLANT
PACK BASIN DAY SURGERY FS (CUSTOM PROCEDURE TRAY) ×3 IMPLANT
SPEAR EYE SURGICAL ST (MISCELLANEOUS) IMPLANT
STRIP CLOSURE SKIN 1/2X4 (GAUZE/BANDAGES/DRESSINGS) ×2 IMPLANT
SUT MERSILENE 6 0 S14 DA (SUTURE) IMPLANT
SUT VICRYL 6 0 S 29 12 (SUTURE) ×6 IMPLANT
SUT VICRYL 7 0 TG140 8 (SUTURE) IMPLANT
SUT VICRYL 8 0 TG140 8 (SUTURE) IMPLANT
TOWEL OR 17X26 10 PK STRL BLUE (TOWEL DISPOSABLE) ×6 IMPLANT
TRAY DSU PREP LF (CUSTOM PROCEDURE TRAY) ×3 IMPLANT
TUBE CONNECTING 12'X1/4 (SUCTIONS)
TUBE CONNECTING 12X1/4 (SUCTIONS) IMPLANT
WATER STERILE IRR 500ML POUR (IV SOLUTION) IMPLANT

## 2019-04-26 NOTE — Anesthesia Procedure Notes (Signed)
Procedure Name: LMA Insertion Date/Time: 04/26/2019 8:47 AM Performed by: Lyn Hollingshead, MD Pre-anesthesia Checklist: Patient identified, Emergency Drugs available, Suction available and Patient being monitored Patient Re-evaluated:Patient Re-evaluated prior to induction Oxygen Delivery Method: Circle system utilized Preoxygenation: Pre-oxygenation with 100% oxygen Induction Type: IV induction Ventilation: Mask ventilation without difficulty LMA: LMA flexible inserted LMA Size: 5.0 Number of attempts: 1 Airway Equipment and Method: Bite block Placement Confirmation: positive ETCO2 Tube secured with: Tape Dental Injury: Teeth and Oropharynx as per pre-operative assessment

## 2019-04-26 NOTE — Interval H&P Note (Signed)
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  The various methods of treatment have been discussed with the patient and family. After consideration of risks, benefits and other options for treatment, the patient has consented to  Procedure(s): LEFT MEDIAN RECTUS RESECTION (Left) as a surgical intervention.  The patient's history has been reviewed, patient examined, no change in status, stable for surgery.  I have reviewed the patient's chart and labs.  Questions were answered to the patient's satisfaction.     Gevena Cotton

## 2019-04-26 NOTE — Discharge Instructions (Signed)
° ° ° ° °  Post Anesthesia Home Care Instructions  Activity: Get plenty of rest for the remainder of the day. A responsible individual must stay with you for 24 hours following the procedure.  For the next 24 hours, DO NOT: -Drive a car -Paediatric nurse -Drink alcoholic beverages -Take any medication unless instructed by your physician -Make any legal decisions or sign important papers.  Meals: Start with liquid foods such as gelatin or soup. Progress to regular foods as tolerated. Avoid greasy, spicy, heavy foods. If nausea and/or vomiting occur, drink only clear liquids until the nausea and/or vomiting subsides. Call your physician if vomiting continues.  Special Instructions/Symptoms: Your throat may feel dry or sore from the anesthesia or the breathing tube placed in your throat during surgery. If this causes discomfort, gargle with warm salt water. The discomfort should disappear within 24 hours.   Do not  Take any nonsteroidal anti inflammatories until after 4:00 pm today    Call your surgeon if you experience:   1.  Fever over 101.0. 2.  Inability to urinate. 3.  Nausea and/or vomiting. 4.  Extreme swelling or bruising at the surgical site. 5.  Continued bleeding from the incision. 6.  Increased pain, redness or drainage from the incision. 7.  Problems related to your pain medication. 8.  Any visual changes 9.  Any problems and/or concerns

## 2019-04-26 NOTE — Anesthesia Postprocedure Evaluation (Signed)
Anesthesia Post Note  Patient: Matthew Hines  Procedure(s) Performed: LEFT MEDIAN RECTUS RESECTION (Left Eye)     Patient location during evaluation: PACU Anesthesia Type: General Level of consciousness: awake Pain management: pain level controlled Vital Signs Assessment: post-procedure vital signs reviewed and stable Respiratory status: spontaneous breathing Cardiovascular status: stable Postop Assessment: no apparent nausea or vomiting Anesthetic complications: no    Last Vitals:  Vitals:   04/26/19 1134 04/26/19 1221  BP: 121/65 129/86  Pulse:  68  Resp: 17 16  Temp:  37.3 C  SpO2: 98% 96%    Last Pain:  Vitals:   04/26/19 1221  TempSrc:   PainSc: 0-No pain   Pain Goal: Patients Stated Pain Goal: 8 (04/26/19 1026)                 [name]

## 2019-04-26 NOTE — Transfer of Care (Signed)
  Last Vitals:  Vitals Value Taken Time  BP 121/63 04/26/19 1026  Temp    Pulse 90 04/26/19 1028  Resp 15 04/26/19 1028  SpO2 96 % 04/26/19 1028  Vitals shown include unvalidated device data.  Last Pain:  Vitals:   04/26/19 0647  TempSrc:   PainSc: 0-No pain      Patients Stated Pain Goal: 8 (04/26/19 3779)  Immediate Anesthesia Transfer of Care Note  Patient: Matthew Hines  Procedure(s) Performed: Procedure(s) (LRB): LEFT MEDIAN RECTUS RESECTION (Left)  Patient Location: PACU  Anesthesia Type: General  Level of Consciousness:sleepy  Airway & Oxygen Therapy: Patient Spontanous Breathing and Patient connected to nasal cannula oxygen  Post-op Assessment: Report given to PACU RN and Post -op Vital signs reviewed and stable  Post vital signs: Reviewed and stable  Complications: No apparent anesthesia complications

## 2019-04-26 NOTE — Brief Op Note (Signed)
04/26/2019  10:21 AM  PATIENT:  Matthew Hines  33 y.o. 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  POST-OPERATIVE DIAGNOSIS:  LEFT EYE - EXOTROPIA ANISOMETROPIC AMBLYOPIA   PROCEDURE:  Procedure(s): LEFT MEDIAN RECTUS RESECTION (Left)  SURGEON:  Surgeon(s) and Role:    Gevena Cotton, MD - Primary  PHYSICIAN ASSISTANT:   ASSISTANTS: none   ANESTHESIA:   general  EBL:  1 mL   BLOOD ADMINISTERED:none  DRAINS: none   LOCAL MEDICATIONS USED:  NONE  SPECIMEN:  No Specimen  DISPOSITION OF SPECIMEN:  N/A  COUNTS:  YES  TOURNIQUET:  * No tourniquets in log *  DICTATION: .Other Dictation: Dictation Number [id number]  PLAN OF CARE: Discharge to home after PACU  PATIENT DISPOSITION:  PACU - hemodynamically stable.   Delay start of Pharmacological VTE agent (>24hrs) due to surgical blood loss or risk of bleeding: no

## 2019-04-26 NOTE — Anesthesia Preprocedure Evaluation (Deleted)
Anesthesia Evaluation  Patient identified by MRN, date of birth, ID band Patient awake    Reviewed: Allergy & Precautions, NPO status , Patient's Chart, lab work & pertinent test results  Airway Mallampati: II       Dental no notable dental hx. (+) Teeth Intact   Pulmonary Current Smoker,    Pulmonary exam normal breath sounds clear to auscultation       Cardiovascular negative cardio ROS Normal cardiovascular exam Rhythm:Regular Rate:Normal     Neuro/Psych PSYCHIATRIC DISORDERS    GI/Hepatic negative GI ROS, Neg liver ROS,   Endo/Other  negative endocrine ROS  Renal/GU negative Renal ROS  negative genitourinary   Musculoskeletal negative musculoskeletal ROS (+)   Abdominal (+) + obese,   Peds  Hematology negative hematology ROS (+)   Anesthesia Other Findings   Reproductive/Obstetrics                             Anesthesia Physical  Anesthesia Plan  ASA: II  Anesthesia Plan: General   Post-op Pain Management:    Induction: Intravenous  PONV Risk Score and Plan: 1 and Ondansetron and Dexamethasone  Airway Management Planned: LMA  Additional Equipment: None  Intra-op Plan:   Post-operative Plan: Extubation in OR  Informed Consent: I have reviewed the patients History and Physical, chart, labs and discussed the procedure including the risks, benefits and alternatives for the proposed anesthesia with the patient or authorized representative who has indicated his/her understanding and acceptance.     Dental advisory given  Plan Discussed with: CRNA  Anesthesia Plan Comments:         Anesthesia Quick Evaluation

## 2019-04-26 NOTE — Op Note (Signed)
Matthew Hines, PETIT MEDICAL RECORD [id number] ACCOUNT [id number] DATE OF BIRTH:[birth year] FACILITY: [facility] LOCATION: [facility] PHYSICIAN:[place] Matthew Cutter, MD  OPERATIVE REPORT  DATE OF PROCEDURE:  04/26/2019  PREOPERATIVE DIAGNOSIS:  Recurrent exotropia, left eye.  PROCEDURE:  Left medial rectus resection and advancement on adjustable suture of 5 mm, left lateral rectus recession of 8 mm.  ANESTHESIA:  General.  POSTOPERATIVE DIAGNOSIS:  Status post repair of strabismus.  SURGEON:  Venia Carbon. Frederico Hamman, MD  ANESTHESIA:  General with laryngeal mask airway.  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  The patient is status post extraocular muscle surgery in infancy.  This procedure was indicated to restore alignment of visual axis and  restore single binocular vision.  DESCRIPTION OF TECHNIQUE:  The patient was taken into the operating room and placed in the supine position.  The entire face was prepped and draped in the usual sterile fashion.  My attention was first directed to the left eye.  Forced duction tests were performed and found to be negative.  The globe was then held in the inferior temporal quadrant.  The eye was elevated and adducted.  An incision was made through the inferior temporal fornix and taken down to the posterior sub-Tenons space.  The left lateral rectus tendon was then isolated on a Stevens hook,and subsequently on a green hook.  A second green hook was then passed beneath the tendon.  This was used to hold the globe in an elevated and adducted position.  Next, the tendon was then imbricated  on 6-0 Vicryl suture, taking 2 locking bites at the medial and temporal apices.  The tendon was found to have  been previously resected, so there was cicatricial tissue that was negotiated at its insertion.  It was then recessed 8 mm from its nativedinsertion and reattached to the globe using the  preplaced sutures.  The sutures were tied securely, and the conjunctiva was repositioned.  My attention was then directed to the ipsilateral left medial rectus tendon.  The globe was held in the inferior nasal quadrant.  The eye was elevated and abducted.  An incision was then made through the inferior nasal fornix, taken down to the posterior sub-Tenons space.  The left medial rectus tendon was then isolated on a Stevens hook and subsequently on a green hook.  This tendon was found to have been recessed approximately 5 mm from its native insertion.  It was involved with significant cicatricial tissue.  The tendon was then carefully dissected free from its overlying muscle fascia and this cicatricial  tissue, and once it was exposed, it was imbricated on 6-0 Vicryl suture, taking 2 locking bites at the medial and temporal apices.  The tendon was then carefully dissected free from the globe and advanced to its native insertion site on the pre placed sutures.  The sutures were tied in an adjustable suture fashion.  Then the conjunctiva was repositioned.  The patient was then subsequently awakened from the anesthesia and transferred to the recovery room and allowed to awaken for approximately 30  minutes and then returned to the operative suite and adjusted to orthophoria.  Sutures were tied permanently and the conjunctiva was repositioned.  At the conclusion of the procedure, TobraDex ointment was instilled in inferior fornices of the left  eye.  There were no apparent complications.  LN/NUANCE  D:04/26/2019 T:04/26/2019 JOB:[id number]

## 2019-04-27 ENCOUNTER — Encounter (HOSPITAL_BASED_OUTPATIENT_CLINIC_OR_DEPARTMENT_OTHER): Payer: Self-pay | Admitting: Ophthalmology
